# Patient Record
Sex: Male | Born: 1980 | Race: White | Hispanic: Yes | Marital: Married | Smoking: Never smoker
Health system: Southern US, Community
[De-identification: ages and names within clinical notes are randomized; demographics above are authoritative.]

## PROBLEM LIST (undated history)

## (undated) DIAGNOSIS — L0390 Cellulitis, unspecified: Secondary | ICD-10-CM

## (undated) HISTORY — PX: NO PAST SURGERIES: SHX2092

---

## 2008-07-15 ENCOUNTER — Emergency Department (HOSPITAL_COMMUNITY): Admission: EM | Admit: 2008-07-15 | Discharge: 2008-07-15 | Payer: Self-pay | Admitting: Emergency Medicine

## 2010-09-18 LAB — GLUCOSE, CAPILLARY: Glucose-Capillary: 310 mg/dL — ABNORMAL HIGH (ref 70–99)

## 2011-08-24 ENCOUNTER — Ambulatory Visit: Payer: Self-pay | Admitting: Family Medicine

## 2011-08-24 DIAGNOSIS — E119 Type 2 diabetes mellitus without complications: Secondary | ICD-10-CM

## 2011-08-24 DIAGNOSIS — R05 Cough: Secondary | ICD-10-CM

## 2011-08-24 DIAGNOSIS — J329 Chronic sinusitis, unspecified: Secondary | ICD-10-CM

## 2011-08-24 DIAGNOSIS — R059 Cough, unspecified: Secondary | ICD-10-CM

## 2011-08-24 LAB — POCT GLYCOSYLATED HEMOGLOBIN (HGB A1C)

## 2011-08-24 MED ORDER — FLUTICASONE PROPIONATE 50 MCG/ACT NA SUSP: 2.0000 | Freq: Every day | NASAL | Status: AC

## 2011-08-24 MED ORDER — AZITHROMYCIN 250 MG PO TABS: ORAL_TABLET | ORAL | Status: AC

## 2011-08-24 MED ORDER — HYDROCODONE-HOMATROPINE 5-1.5 MG/5ML PO SYRP: 5.0000 mL | ORAL_SOLUTION | Freq: Three times a day (TID) | ORAL | Status: AC | PRN

## 2011-08-24 MED ORDER — METFORMIN HCL 1000 MG PO TABS: 1000.0000 mg | ORAL_TABLET | Freq: Two times a day (BID) | ORAL | Status: DC

## 2011-08-24 NOTE — Progress Notes (Signed)
  [facility]:  Office Visit  Chief Complaint:  Chief Complaint  Patient presents with  . Abdominal Pain  . Cough  . Medication Refill    diabetes    HPI: Arthur Cox is a 31 y.o. male who complains of: 1. T2DM-need medication refill. Patient has not been taking meds x 2 months ran out of med. Denies neuropathy sxs, vision changes, urinary problems. 2. Cough-x 2 months, sinus drainge. Midepigastric abd pain when cough. Denies fevers, hemoptysis. No wheezing or SOB. ? H/o childhood asthma  Past Medical History  Diagnosis Date  . Asthma   . Diabetes mellitus    No past surgical history on file. History   Social History  . Marital Status: Single    Spouse Name: N/A    Number of Children: N/A  . Years of Education: N/A   Social History Main Topics  . Smoking status: Never Smoker   . Smokeless tobacco: None  . Alcohol Use: Yes  . Drug Use: No  . Sexually Active: None   Other Topics Concern  . None   Social History Narrative  . None   No family history on file. No Known Allergies Prior to Admission medications   Not on File     ROS: The patient denies fevers, chills, night sweats, unintentional weight loss, chest pain, palpitations, wheezing, dyspnea on exertion, nausea, vomiting, dysuria, hematuria, melena, numbness, weakness, or tingling.  All other systems have been reviewed and were otherwise negative with the exception of those mentioned in the HPI and as above.    PHYSICAL EXAM: Filed Vitals:   08/24/11 1012  BP: 132/79  Pulse: 96  Temp: 99 F (37.2 C)  Resp: 16   Filed Vitals:   08/24/11 1012  Height: 5' 4.25" (1.632 m)  Weight: 191 lb (86.637 kg)   Body mass index is 32.53 kg/(m^2).  General: Alert, no acute distress HEENT:  Normocephalic, atraumatic, oropharynx patent. Tm nl, erythematous nasal passage, erythematous throat, no exudates, + maxillary sinus tenderness Cardiovascular:  Regular rate and rhythm, no rubs murmurs or  gallops.  No Carotid bruits, radial pulse intact. No pedal edema.  Respiratory: Clear to auscultation bilaterally.  No wheezes, rales, or rhonchi.  No cyanosis, no use of accessory musculature GI: No organomegaly, abdomen is soft and non-tender, positive bowel sounds.  No masses. No guarding or rebound Skin: No rashes. Neurologic: Facial musculature symmetric. Psychiatric: Patient is appropriate throughout our interaction. Lymphatic: No cervical lymphadenopathy Musculoskeletal: Gait intact.   LABS: Results for orders placed in visit on 08/24/11  POCT GLYCOSYLATED HEMOGLOBIN (HGB A1C)      Component Value Range   Hemoglobin A1C 8.5%+       EKG/XRAY:   Primary read interpreted by Dr. Conley Rolls at [facility].   ASSESSMENT/PLAN: Encounter Diagnoses  Name Primary?  . Diabetes mellitus Yes  . Cough   . Rhinosinusitis    T2DM-not well controlled, patient was not taking meds x 2 months ran out of meds.  Rhinosinutsitis-Z-pack and cough medicine, if no improvement then need to return for Xray since has had cough for 2 months. Patient to defer xray because no insurance.     Kaiyan Luczak PHUONG, DO 08/24/2011 11:56 AM

## 2012-12-17 ENCOUNTER — Inpatient Hospital Stay (HOSPITAL_COMMUNITY)
Admission: EM | Admit: 2012-12-17 | Discharge: 2012-12-19 | DRG: 603 | Disposition: A | Discharge status: Home / Self Care | Payer: Medicaid Other | Attending: Internal Medicine | Admitting: Internal Medicine

## 2012-12-17 ENCOUNTER — Emergency Department (HOSPITAL_COMMUNITY): Payer: Medicaid Other

## 2012-12-17 ENCOUNTER — Encounter (HOSPITAL_COMMUNITY): Payer: Self-pay | Admitting: Emergency Medicine

## 2012-12-17 DIAGNOSIS — E1165 Type 2 diabetes mellitus with hyperglycemia: Secondary | ICD-10-CM | POA: Diagnosis present

## 2012-12-17 DIAGNOSIS — L02619 Cutaneous abscess of unspecified foot: Principal | ICD-10-CM | POA: Diagnosis present

## 2012-12-17 DIAGNOSIS — J45909 Unspecified asthma, uncomplicated: Secondary | ICD-10-CM | POA: Diagnosis present

## 2012-12-17 DIAGNOSIS — Z9119 Patient's noncompliance with other medical treatment and regimen: Secondary | ICD-10-CM

## 2012-12-17 DIAGNOSIS — IMO0001 Reserved for inherently not codable concepts without codable children: Secondary | ICD-10-CM | POA: Diagnosis present

## 2012-12-17 DIAGNOSIS — Z91199 Patient's noncompliance with other medical treatment and regimen due to unspecified reason: Secondary | ICD-10-CM

## 2012-12-17 DIAGNOSIS — L0390 Cellulitis, unspecified: Secondary | ICD-10-CM

## 2012-12-17 DIAGNOSIS — L03119 Cellulitis of unspecified part of limb: Principal | ICD-10-CM | POA: Diagnosis present

## 2012-12-17 DIAGNOSIS — I10 Essential (primary) hypertension: Secondary | ICD-10-CM | POA: Diagnosis present

## 2012-12-17 DIAGNOSIS — L0291 Cutaneous abscess, unspecified: Secondary | ICD-10-CM

## 2012-12-17 DIAGNOSIS — Z833 Family history of diabetes mellitus: Secondary | ICD-10-CM

## 2012-12-17 HISTORY — DX: Cellulitis, unspecified: L03.90

## 2012-12-17 LAB — CREATININE, SERUM
Creatinine, Ser: 0.54 mg/dL (ref 0.50–1.35)
GFR calc Af Amer: >90 mL/min (ref >90)
GFR calc non Af Amer: >90 mL/min (ref >90)

## 2012-12-17 LAB — CBC WITH DIFFERENTIAL/PLATELET
Basophils Absolute: 0 10*3/uL (ref 0.0–0.1)
HCT: 44.3 % (ref 39.0–52.0)
Lymphocytes Relative: 30 % (ref 12–46)
Neutro Abs: 5.7 10*3/uL (ref 1.7–7.7)
Platelets: 230 10*3/uL (ref 150–400)
RBC: 5.31 MIL/uL (ref 4.22–5.81)
RDW: 12.3 % (ref 11.5–15.5)
WBC: 9.7 10*3/uL (ref 4.0–10.5)

## 2012-12-17 LAB — CBC
MCV: 84.6 fL (ref 78.0–100.0)
Platelets: 208 10*3/uL (ref 150–400)
RBC: 5.14 MIL/uL (ref 4.22–5.81)
WBC: 8 10*3/uL (ref 4.0–10.5)

## 2012-12-17 LAB — SEDIMENTATION RATE: Sed Rate: 2 mm/hr (ref 0–16)

## 2012-12-17 LAB — HEMOGLOBIN A1C
Hgb A1c MFr Bld: 10.9 % — ABNORMAL HIGH (ref <5.7)
Mean Plasma Glucose: 266 mg/dL — ABNORMAL HIGH (ref <117)

## 2012-12-17 LAB — BASIC METABOLIC PANEL
CO2: 24 mEq/L (ref 19–32)
Chloride: 95 mEq/L — ABNORMAL LOW (ref 96–112)
Sodium: 131 mEq/L — ABNORMAL LOW (ref 135–145)

## 2012-12-17 MED ORDER — ACETAMINOPHEN 325 MG PO TABS: 650.0000 mg | ORAL_TABLET | Freq: Four times a day (QID) | ORAL | Status: DC | PRN

## 2012-12-17 MED ORDER — PIPERACILLIN-TAZOBACTAM 3.375 G IVPB
3.3750 g | Freq: Three times a day (TID) | INTRAVENOUS | Status: DC
  Administered 2012-12-17 – 2012-12-19 (×7): 3.375 g via INTRAVENOUS
  Filled 2012-12-17 (×10): qty 50

## 2012-12-17 MED ORDER — SENNOSIDES-DOCUSATE SODIUM 8.6-50 MG PO TABS
1.0000 | ORAL_TABLET | Freq: Every evening | ORAL | Status: DC | PRN
  Filled 2012-12-17: qty 1

## 2012-12-17 MED ORDER — ONDANSETRON HCL 4 MG/2ML IJ SOLN: 4.0000 mg | Freq: Four times a day (QID) | INTRAMUSCULAR | Status: DC | PRN

## 2012-12-17 MED ORDER — VANCOMYCIN HCL IN DEXTROSE 1-5 GM/200ML-% IV SOLN
1000.0000 mg | Freq: Once | INTRAVENOUS | Status: AC
  Administered 2012-12-17: 1000 mg via INTRAVENOUS
  Filled 2012-12-17: qty 200

## 2012-12-17 MED ORDER — MUPIROCIN CALCIUM 2 % EX CREA
TOPICAL_CREAM | Freq: Two times a day (BID) | CUTANEOUS | Status: DC
  Administered 2012-12-17 – 2012-12-19 (×4): via TOPICAL
  Filled 2012-12-17: qty 15

## 2012-12-17 MED ORDER — SODIUM CHLORIDE 0.9 % IV SOLN
INTRAVENOUS | Status: DC
  Administered 2012-12-17: 08:00:00 via INTRAVENOUS

## 2012-12-17 MED ORDER — ALUM & MAG HYDROXIDE-SIMETH 200-200-20 MG/5ML PO SUSP
30.0000 mL | Freq: Four times a day (QID) | ORAL | Status: DC | PRN
  Filled 2012-12-17: qty 30

## 2012-12-17 MED ORDER — LIVING WELL WITH DIABETES BOOK - IN SPANISH
Freq: Once | Status: DC
  Filled 2012-12-17 (×2): qty 1

## 2012-12-17 MED ORDER — GLIPIZIDE 5 MG PO TABS
5.0000 mg | ORAL_TABLET | Freq: Two times a day (BID) | ORAL | Status: DC
  Administered 2012-12-17 – 2012-12-18 (×2): 5 mg via ORAL
  Filled 2012-12-17 (×5): qty 1

## 2012-12-17 MED ORDER — VANCOMYCIN HCL 10 G IV SOLR
1500.0000 mg | Freq: Two times a day (BID) | INTRAVENOUS | Status: DC
  Administered 2012-12-17 – 2012-12-19 (×5): 1500 mg via INTRAVENOUS
  Filled 2012-12-17 (×6): qty 1500

## 2012-12-17 MED ORDER — MORPHINE SULFATE 4 MG/ML IJ SOLN
4.0000 mg | Freq: Once | INTRAMUSCULAR | Status: AC
  Administered 2012-12-17: 4 mg via INTRAVENOUS
  Filled 2012-12-17: qty 1

## 2012-12-17 MED ORDER — INSULIN ASPART 100 UNIT/ML ~~LOC~~ SOLN: 0.0000 [IU] | Freq: Three times a day (TID) | SUBCUTANEOUS | Status: DC

## 2012-12-17 MED ORDER — HYDRALAZINE HCL 20 MG/ML IJ SOLN: 10.0000 mg | Freq: Four times a day (QID) | INTRAMUSCULAR | Status: DC | PRN

## 2012-12-17 MED ORDER — INSULIN ASPART 100 UNIT/ML ~~LOC~~ SOLN
3.0000 [IU] | Freq: Three times a day (TID) | SUBCUTANEOUS | Status: DC
  Administered 2012-12-17: 3 [IU] via SUBCUTANEOUS
  Administered 2012-12-17: 13:00:00 via SUBCUTANEOUS
  Administered 2012-12-18 – 2012-12-19 (×6): 3 [IU] via SUBCUTANEOUS

## 2012-12-17 MED ORDER — INSULIN ASPART 100 UNIT/ML ~~LOC~~ SOLN: 0.0000 [IU] | Freq: Every day | SUBCUTANEOUS | Status: DC

## 2012-12-17 MED ORDER — ASPIRIN EC 81 MG PO TBEC
81.0000 mg | DELAYED_RELEASE_TABLET | Freq: Every day | ORAL | Status: DC
  Administered 2012-12-17 – 2012-12-19 (×3): 81 mg via ORAL
  Filled 2012-12-17 (×3): qty 1

## 2012-12-17 MED ORDER — ONDANSETRON HCL 4 MG PO TABS: 4.0000 mg | ORAL_TABLET | Freq: Four times a day (QID) | ORAL | Status: DC | PRN

## 2012-12-17 MED ORDER — ENOXAPARIN SODIUM 40 MG/0.4ML ~~LOC~~ SOLN
40.0000 mg | SUBCUTANEOUS | Status: DC
  Administered 2012-12-17 – 2012-12-19 (×3): 40 mg via SUBCUTANEOUS
  Filled 2012-12-17 (×3): qty 0.4

## 2012-12-17 MED ORDER — ACETAMINOPHEN 650 MG RE SUPP: 650.0000 mg | Freq: Four times a day (QID) | RECTAL | Status: DC | PRN

## 2012-12-17 MED ORDER — OXYCODONE HCL 5 MG PO TABS
5.0000 mg | ORAL_TABLET | ORAL | Status: DC | PRN
  Administered 2012-12-17 – 2012-12-18 (×4): 5 mg via ORAL
  Filled 2012-12-17 (×6): qty 1

## 2012-12-17 MED ORDER — INSULIN ASPART 100 UNIT/ML ~~LOC~~ SOLN
0.0000 [IU] | Freq: Three times a day (TID) | SUBCUTANEOUS | Status: DC
Start: 2012-12-17 — End: 2012-12-19
  Administered 2012-12-17 – 2012-12-18 (×4): 3 [IU] via SUBCUTANEOUS
  Administered 2012-12-18: 5 [IU] via SUBCUTANEOUS
  Administered 2012-12-18: 1 [IU] via SUBCUTANEOUS
  Administered 2012-12-19 (×2): 2 [IU] via SUBCUTANEOUS
  Administered 2012-12-19: 1 [IU] via SUBCUTANEOUS

## 2012-12-17 MED ORDER — SODIUM CHLORIDE 0.9 % IV SOLN
Freq: Once | INTRAVENOUS | Status: AC
  Administered 2012-12-17: 04:00:00 via INTRAVENOUS

## 2012-12-17 MED ORDER — TRAZODONE 25 MG HALF TABLET
25.0000 mg | ORAL_TABLET | Freq: Every evening | ORAL | Status: DC | PRN
  Filled 2012-12-17 (×2): qty 1

## 2012-12-17 NOTE — ED Notes (Signed)
MD at bedside. 

## 2012-12-17 NOTE — Progress Notes (Signed)
Interpreter Maveric Debono Namihira for Nancy RN 

## 2012-12-17 NOTE — Progress Notes (Signed)
ANTIBIOTIC CONSULT NOTE - INITIAL  Pharmacy Consult for vancomycin Indication: diabetic foot ulcer / cellulitis  No Known Allergies  Patient Measurements:   Adjusted Body Weight:   Vital Signs: Temp: 98.1 F (36.7 C) (07/17 0755) Temp src: Oral (07/17 0755) BP: 193/89 mmHg (07/17 0755) Pulse Rate: 101 (07/17 0755) Intake/Output from previous day: 07/16 0701 - 07/17 0700 In: 1250 [I.V.:1250] Out: -  Intake/Output from this shift:    Labs:  Recent Labs  12/17/12 0410 12/17/12 1052  WBC 9.7 8.0  HGB 16.3 15.0  PLT 230 208  CREATININE 0.72 0.54   The CrCl is unknown because both a height and weight (above a minimum accepted value) are required for this calculation. No results found for this basename: VANCOTROUGH, VANCOPEAK, VANCORANDOM, GENTTROUGH, GENTPEAK, GENTRANDOM, TOBRATROUGH, TOBRAPEAK, TOBRARND, AMIKACINPEAK, AMIKACINTROU, AMIKACIN,  in the last 72 hours   Microbiology: No results found for this or any previous visit (from the past 720 hour(s)).  Medical History: Past Medical History  Diagnosis Date  . Asthma   . Diabetes mellitus   . Cellulitis 12/17/2012    RIGHT FOOT    Medications:  Scheduled:  . aspirin EC  81 mg Oral Daily  . enoxaparin (LOVENOX) injection  40 mg Subcutaneous Q24H  . glipiZIDE  5 mg Oral BID AC  . insulin aspart  0-9 Units Subcutaneous TID WC  . insulin aspart  3 Units Subcutaneous TID WC  . living well with diabetes book- in spanish   Does not apply Once  . mupirocin cream   Topical BID  . piperacillin-tazobactam (ZOSYN)  IV  3.375 g Intravenous Q8H   Infusions:  . sodium chloride 100 mL/hr at 12/17/12 6213   Assessment: 32 yo male with diabetic foot ulcer / cellulitis will be continued on vancomycin.  Patient received vancomycin 1g at 0403 on 12/17/12.  SCr 0.54 (CrCl >100); Wt 86.6 kg  Goal of Therapy:  Vancomycin trough level 10-15 mcg/ml  Plan:  1) Vancomycin 1500mg  iv q12h, 1st dose now 2) Monitor renal function,  culture, and plan on antibiotic before checking vancomycin trough.  Arthur Cox, Tsz-Yin 12/17/2012,2:01 PM

## 2012-12-17 NOTE — Plan of Care (Signed)
Problem: Food- and Nutrition-Related Knowledge Deficit (NB-1.1) Goal: Nutrition education Formal process to instruct or train a patient/client in a skill or to impart knowledge to help patients/clients voluntarily manage or modify food choices and eating behavior to maintain or improve health. Outcome: Completed/Met Date Met:  12/17/12 RD consulted for diet education regarding DM/uncontrolled blood sugars. DM coordinators note reviewed. Pt has been with out medications for his DM for several months. DM coordinator provided basic nutrition information.  RD provided pt with "Carbohydrate Counting for Diabetes" hand out in Spanish. Pt speaks very little English, but would read the hand out. Denied any questions at this time. Pt will likely have better blood sugar control with compliance with medication.   Chart reviewed, no additional nutrition interventions warranted at this time. Pt may benefit from outpatient diabetes education if able. Please make referral if appropriate. Please re-consult as needed.    Clarene Duke RD, LDN Pager [phone] After Hours pager [phone]

## 2012-12-17 NOTE — Progress Notes (Signed)
Utilization review completed. Branda Chaudhary, RN, BSN. 

## 2012-12-17 NOTE — ED Notes (Signed)
Pt. Speaks minimal English.

## 2012-12-17 NOTE — ED Notes (Signed)
Pt. Returned from xray 

## 2012-12-17 NOTE — H&P (Signed)
[facility] History and Physical  Dock Baccam ZOX:[id number] DOB: Mar 06, 1981 DOA: 12/17/2012  Referring physician: Derwood Kaplan PCP: Toya Smothers R  Specialists: None  Chief Complaint: Right foot scab with associated redness, pain and swelling  HPI: Arthur Cox is a 32 y.o. male with a history of uncontrolled diabetes, who presented to the ED complaining of right foot pain.  About a week ago he thinks he was bitten by a bug on his right foot, since then he began scratching that area and developed redness and swelling in the area two days ago.  Last night he began having pain in his right foot that radiated into his leg.  He has tried applying lemon, salt, and an unknown cream to the sore on his foot, without relief.   He has never had anything similar to this before.  Four months ago he stopped taking his diabetes medications due to not being able to afford them.  He has not been monitoring his blood sugar.   His last BM was last PM.  He has no other complaints and denies fever, nausea, vomiting, diarrhea, chest pain, sob, dizziness, lightheadedness, abdominal pain.   Review of Systems: The patient denies anorexia, chest pain, syncope, dyspnea on exertion, peripheral edema, balance deficits, abdominal pain, melena, hematochezia,difficulty walking.  Past Medical History  Diagnosis Date  . Asthma   . Diabetes mellitus   . Cellulitis 12/17/2012    RIGHT FOOT   Past Surgical History  Procedure Laterality Date  . No past surgeries     Social History:  reports that he has never smoked. He has never used smokeless tobacco. He reports that  drinks alcohol. He reports that he does not use illicit drugs. Lives at home Independent of ADLs  No Known Allergies  Family history: Mother-Diabetes, Cancer (unknown type) Father-Diabetes  Prior to Admission medications   Not on File   Physical Exam: Filed Vitals:   12/17/12 0515 12/17/12 0545 12/17/12 0700 12/17/12 0755  BP: 129/86 125/83  124/84 193/89  Pulse: 89 85 80 101  Temp:    98.1 F (36.7 C)  TempSrc:    Oral  Resp: 20 17 17 20   SpO2: 98% 97% 96% 98%     General: WNWD. NAD.  Neck: Normal range of motion.  Neck supple.  Cardiovascular: RRR. no m/r/g.  Respiratory: BBS e and c  Abdomen: Soft, non-tender to palpation.  No distension. Bowel sounds are normal.  Skin: Approximately 3cm scab to dorsal right foot, with surrounding erythema, and swelling.  Tenderness to palpation.  No purulent drainage.    Musculoskeletal: No decrease in rom or strength in RLE  Neurological: CAO x 3.   Labs on Admission:  Basic Metabolic Panel:  Recent Labs Lab 12/17/12 0410  NA 131*  K 3.7  CL 95*  CO2 24  GLUCOSE 358*  BUN 12  CREATININE 0.72  CALCIUM 9.4   CBC:  Recent Labs Lab 12/17/12 0410  WBC 9.7  NEUTROABS 5.7  HGB 16.3  HCT 44.3  MCV 83.4  PLT 230    Radiological Exams on Admission: Dg Foot 2 Views Right  12/17/2012   *RADIOLOGY REPORT*  Clinical Data: Dorsal right foot soreness.  RIGHT FOOT - 2 VIEW  Comparison: None.  Findings: There is no evidence of fracture or dislocation.  The joint spaces are preserved.  There is no evidence of talar subluxation; the subtalar joint is unremarkable in appearance.  No significant soft tissue abnormalities are seen.  IMPRESSION: No evidence  of fracture or dislocation.   Original Report Authenticated By: Tonia Ghent, M.D.    EKG: None obtained  Assessment/Plan  Cellulitis of right foot -Received a dose of vancomycin in the ED-this will be continued -IV Zosyn started -X-ray of right foot shows no significant soft tissue abnormalities -Wound Care RN asked to make recommendations  Uncontrolled diabetes -A1C of 8.5s last year-recheck today -Glucose in ED of 358 -Sensitive sliding scale insulin, with low dose meal coverage. Start Glucotrol, await A1C, if less than 10, can consider adding Metformin on discharge. If A1C significantly elevated-may need  Insulin started -Monitor blood glucose -Carb modified diet -Diabetic Coordinator for education and recommendations. -Consult with Case management to see if patient can be assisted with diabetic medications  Hypertension -PRN hydralazine -Monitor and consider starting oral medications if appropriate.   Code Status: Unknown Family Communication: No family in room this AM Disposition Plan: Inpatient  Time spent: 45 minutes  Marco Collie, [place] [facility] [phone]   If 7PM-7AM, please contact night-coverage [email or web address] Password [facility] 12/17/2012, 10:59 AM   Attending Patient seen and examined, agree with the assessment and plan as outlined above. Continue antibiotics, cellulitic area has been marked, will need to follow clinically. Await A1C. Needs diabetic education and compliance couseling  S Amelianna Meller

## 2012-12-17 NOTE — ED Notes (Signed)
Pt. Has 2cm by 2cm wound on top of right foot. States he got a scratch on Sunday but wound as gotten worse. Skin surrounding wound is red and warm to touch and slightly swollen. Pedal pulse intact, cap refill instant, sensation intact distally from wound. Tender to palpation.

## 2012-12-17 NOTE — ED Notes (Signed)
PT. REPORTS PROGRESSING RIGHT FOOT ABSCESS WITH DRAINAGE ONSET Sunday , DENIES FEVER OR CHILLS .

## 2012-12-17 NOTE — Care Management Note (Signed)
Noted referral to case manager, re financial concerns. CM can assist this pt with medications at d/c, 30 day supply for $3 copay x one only, no pain meds or sedatives. Will setup followup appointment at [facility] on [address] unless pt is to be followed at clinic. Will inform pt of this when interpeter available.  Johny Shock RN MPH Case Manager [phone]

## 2012-12-17 NOTE — Progress Notes (Addendum)
Inpatient Diabetes Program Recommendations  AACE/ADA: New Consensus Statement on Inpatient Glycemic Control (2013)  Target Ranges:  Prepandial:   less than 140 mg/dL      Peak postprandial:   less than 180 mg/dL (1-2 hours)      Critically ill patients:  140 - 180 mg/dL     Patient admitted with cellulitis.  Has history of Type 2 diabetes.  Not taking any medication at home to control his CBGs for 4 months now due to inability to afford medications.  Went in to speak with patient about his DM care and regimen at home (spoke with patient in both limited Spanish and [place]- wife/girlfriend of patient in room to help translate).  Patient told me he stopped his Metformin about 4 months ago b/c he did not have a Rx for it and it was costing him too much money.  Does not have a PCP.  Spoke with pt about his diagnosis.  Discussed basic pathophysiology of DM Type 2, basic home care, importance of checking CBGs and maintaining good CBG control to prevent long-term and short-term complications.  Also discussed basic nutritional guidelines and gave patient a carbohydrate counting guide in Spanish.  Explained to patient that he should avoid sugar sweetened beverages and that he should be careful of his portion sizes of carbohydrate containing foods.  Discussed which foods have carbohydrates.  Patient told me he will eat 10 tortillas in one meal.  Encouraged patient to eat 1 or 2 tortillas per meal (depending on how big the tortillas are) and explained that each meal should have between 60-75 grams of carbohydrates total.  Patient appeared shocked that he can't eat so much with mealtimes.  RNs to provide ongoing basic DM education at bedside with this patient.  Have ordered educational booklet (in [place]) and Spanish DM videos.  Will ask RD to see patient to provide further nutritional instruction.  Will ask care management to see patient to see if we can get him set up at the [facility] after d/c for primary care.   MD- Please order A1c test so we can assess patient's pre-hospital glucose control.  Likely to be elevated since he has an ongoing infection.  Also, please keep cost considerations in mind when making decisions about meds to send patient home on- Patient has no health insurance.  Thanks!   Will follow. Ambrose Finland RN, MSN, CDE Diabetes Coordinator Inpatient Diabetes Program [phone]

## 2012-12-17 NOTE — ED Provider Notes (Signed)
History    CSN: [id number] Arrival date & time 12/17/12  0212  First MD Initiated Contact with Patient 12/17/12 [phone]     Chief Complaint  Patient presents with  . Foot Pain   (Consider location/radiation/quality/duration/timing/severity/associated sxs/prior Treatment) HPI Comments: 32 y/o comes in with cc of foot pain. On Sunday, patient ended up damaging himself while scratching, and overtime the area has gotten more red, largert and more painful. There is no n/v/f/c. The pain is worse with movement. He thinks there was whitish discharge earlier in the morning. Pt has hx of diabetes, not taking his meds.  Patient is a 32 y.o. male presenting with lower extremity pain. The history is provided by the patient.  Foot Pain   Past Medical History  Diagnosis Date  . Asthma   . Diabetes mellitus    History reviewed. No pertinent past surgical history. No family history on file. History  Substance Use Topics  . Smoking status: Never Smoker   . Smokeless tobacco: Not on file  . Alcohol Use: Yes    Review of Systems  Constitutional: Positive for activity change. Negative for fever and chills.  Gastrointestinal: Negative for nausea and vomiting.  Musculoskeletal: Positive for myalgias and arthralgias.  Skin: Positive for rash.    Allergies  Review of patient's allergies indicates no known allergies.  Home Medications  No current outpatient prescriptions on file. BP 125/83  Pulse 85  Temp(Src) 99.5 F (37.5 C) (Oral)  Resp 17  SpO2 97% Physical Exam  Nursing note and vitals reviewed. Constitutional: He is oriented to person, place, and time. He appears well-developed.  HENT:  Head: Normocephalic and atraumatic.  Eyes: Conjunctivae and EOM are normal. Pupils are equal, round, and reactive to light.  Neck: Normal range of motion. Neck supple.  Cardiovascular: Normal rate and regular rhythm.   Pulmonary/Chest: Effort normal and breath sounds normal.  Abdominal: Soft.  Bowel sounds are normal. He exhibits no distension. There is no tenderness. There is no rebound and no guarding.  Musculoskeletal:  Pt has a 3cm scab in the dorsal right foot, with surrounding erythema, and swelling. Significant tenderness to palpation. No purulent drainage. Tenderness worse with passive ROM of the ankle. The palpable tenderness tracks up to the distal tibia.  Neurological: He is alert and oriented to person, place, and time.  Skin: Skin is warm.    ED Course  Procedures (including critical care time) Labs Reviewed  CBC WITH DIFFERENTIAL - Abnormal; Notable for the following:    MCHC 36.8 (*)    All other components within normal limits  BASIC METABOLIC PANEL - Abnormal; Notable for the following:    Sodium 131 (*)    Chloride 95 (*)    Glucose, Bld 358 (*)    All other components within normal limits   Dg Foot 2 Views Right  12/17/2012   *RADIOLOGY REPORT*  Clinical Data: Dorsal right foot soreness.  RIGHT FOOT - 2 VIEW  Comparison: None.  Findings: There is no evidence of fracture or dislocation.  The joint spaces are preserved.  There is no evidence of talar subluxation; the subtalar joint is unremarkable in appearance.  No significant soft tissue abnormalities are seen.  IMPRESSION: No evidence of fracture or dislocation.   Original Report Authenticated By: Tonia Ghent, M.D.   No diagnosis found.  MDM  Pt with hx of diabetes - non compliance, uninsurance, no follow up - comes in with foot pain. Pt has clinical evidence of cellulitis. Possible  abscess, although no drainage appreciated during our exam. No clear evidence of abscess. I think the infection is deep, and spreading though - Xray ordered to ensure there is no free air. Joint involvement not suspected. Will admit with ivab. Admitting team will berequested to monitor closely, and if needed get further diagnostic imaging.  Derwood Kaplan, MD 12/17/12 [phone]

## 2012-12-17 NOTE — Consult Note (Addendum)
WOC consult Note Reason for Consult: Consult requested for right foot wound.  Able to converse with patient in limited manner in Spanish. He is on IV antibiotics for treatment. Wound type: cellulitis; no open wound at present Measurement: 2X1cm  Wound bed: dark purple blistered wound bed; has evolved into dry hard scab.  Expect it will become full thickness skin loss when scab is softened and removed. Drainage (amount, consistency, odor) No drainage at present. Periwound: Generalized erythremia surrounding wound to approx 4 cm.  Area has been marked and appears to be receeding slowly, according to patient. Dressing procedure/placement/frequency: Bactroban BID to soften scab and promote healing. Please re-consult if further assistance is needed.  Thank-you,  Cammie Mcgee MSN, RN, CWOCN, [place], CNS [phone]

## 2012-12-18 DIAGNOSIS — I10 Essential (primary) hypertension: Secondary | ICD-10-CM

## 2012-12-18 LAB — CBC
HCT: 41.2 % (ref 39.0–52.0)
RDW: 12.5 % (ref 11.5–15.5)
WBC: 7 10*3/uL (ref 4.0–10.5)

## 2012-12-18 LAB — BASIC METABOLIC PANEL
Chloride: 103 mEq/L (ref 96–112)
Creatinine, Ser: 0.66 mg/dL (ref 0.50–1.35)
GFR calc Af Amer: >90 mL/min (ref >90)
Potassium: 4.6 mEq/L (ref 3.5–5.1)

## 2012-12-18 LAB — GLUCOSE, CAPILLARY
Glucose-Capillary: 164 mg/dL — ABNORMAL HIGH (ref 70–99)
Glucose-Capillary: 290 mg/dL — ABNORMAL HIGH (ref 70–99)
Glucose-Capillary: 146 mg/dL — ABNORMAL HIGH (ref 70–99)

## 2012-12-18 MED ORDER — BD GETTING STARTED TAKE HOME KIT: 1/2ML X 30G SYRINGES
1.0000 | Freq: Once | Status: AC
  Administered 2012-12-18: 1
  Filled 2012-12-18: qty 1

## 2012-12-18 MED ORDER — LISINOPRIL 5 MG PO TABS
5.0000 mg | ORAL_TABLET | Freq: Every day | ORAL | Status: DC
  Administered 2012-12-18 – 2012-12-19 (×2): 5 mg via ORAL
  Filled 2012-12-18 (×2): qty 1

## 2012-12-18 MED ORDER — INSULIN GLARGINE 100 UNIT/ML ~~LOC~~ SOLN
10.0000 [IU] | Freq: Every day | SUBCUTANEOUS | Status: DC
  Administered 2012-12-18 – 2012-12-19 (×2): 10 [IU] via SUBCUTANEOUS
  Filled 2012-12-18 (×2): qty 0.1

## 2012-12-18 NOTE — Progress Notes (Addendum)
Pt. Has been practicing how to give the insulin himself.The RN has been assessing and assisting pt. As well.pt. Got the "Living well with diabetes" on spanish.Wife at the bedside,very supportive.keep monitoring pt. Closely.Pt. Also got the getting started kit.

## 2012-12-18 NOTE — Progress Notes (Signed)
[facility] PROGRESS NOTE  Arthur Cox ZOX:[id number] DOB: [birth year] DOA: 12/17/2012 PCP: Arthur Cox  Assessment/Plan  Cellulitis of the right foot, improving on vancomycin and Zosyn -  Anticipate transitioning over to oral antibiotics, likely clindamycin and ciprofloxacin tomorrow to complete a seven-day course  Diabetes mellitus type 2, uncontrolled -  Hemoglobin A1c of 10.9 -  All in the hospital, continue long-acting insulin with Arthur Cox acting insulin with meals - Start Lantus 10 units once daily -  Continue low-dose sliding scale insulin -  We'll have the diabetes educator talk to the patient about insulin 7030 -  Nursing staff and diabetes educator to practice fingersticks and insulin injections with the patient -  Case manager working on providing assistance for medications, including orange card  Hypertension, blood pressures were elevated somewhat yesterday -  Start low-dose lisinopril -  Will need BMP and one to 2 weeks to check creatinine  Diet:  Diabetic diet Access:  PIV IVF:  KVO Proph:  Lovenox  Code Status: Full code Family Communication: Spoke with the patient alone Disposition Plan: Pending improvement in his cellulitis and further education about diet 80s management, home tomorrow   Consultants:  Diabetes educator Case management nutrition   Procedures:   x-ray foot  Antibiotics:  Vancomycin and Zosyn from July 17   HPI/Subjective:  Spoke via interpreter: Patient states the pain and swelling and redness of his right foot have improved.  He denies fevers and chills, vomiting, constipation. He had an episode of abdominal pain near his bellybutton yesterday that resolved.   Objective: Filed Vitals:   12/17/12 0755 12/17/12 1532 12/17/12 1800 12/18/12 0458  BP: 193/89 125/66 125/74 127/83  Pulse: 101 89 86 81  Temp: 98.1 F (36.7 C) 98.4 F (36.9 C) 98.4 F (36.9 C) 98.5 F (36.9 C)  TempSrc: Oral Oral Oral Oral  Resp: 20 20 20 17    SpO2: 98% 98%  98%    Intake/Output Summary (Last 24 hours) at 12/18/12 1028 Last data filed at 12/17/12 1843  Gross per 24 hour  Intake    480 ml  Output      0 ml  Net    480 ml   There were no vitals filed for this visit.  Exam:   General:  Hispanic Spanish-speaking only male,  No acute distress  HEENT:  NCAT,  MMM  Cardiovascular:   RRR, nl S1, S2 no mrg, 2+ pulses, warm extremities  Respiratory:   CTAB, no increased WOB  Abdomen:   NABS, soft, NT/ND  MSK:   Normal tone and bulk, no LEE, right foot with a 1-1/2-2 cm eschar on the dorsum of the foot with some surrounding erythema extending approximately 2 cm. The area of erythema has withdrawn from the previously drawn line.  Some induration and warmth still present.   Neuro:   Grossly intact  Data Reviewed: Basic Metabolic Panel:  Recent Labs Lab 12/17/12 0410 12/17/12 1052 12/18/12 0440  NA 131*  --  138  K 3.7  --  4.6  CL 95*  --  103  CO2 24  --  28  GLUCOSE 358*  --  176*  BUN 12  --  9  CREATININE 0.72 0.54 0.66  CALCIUM 9.4  --  8.6   Liver Function Tests: No results found for this basename: AST, ALT, ALKPHOS, BILITOT, PROT, ALBUMIN,  in the last 168 hours No results found for this basename: LIPASE, AMYLASE,  in the last 168 hours No results found  for this basename: AMMONIA,  in the last 168 hours CBC:  Recent Labs Lab 12/17/12 0410 12/17/12 1052 12/18/12 0440  WBC 9.7 8.0 7.0  NEUTROABS 5.7  --   --   HGB 16.3 15.0 14.4  HCT 44.3 43.5 41.2  MCV 83.4 84.6 85.8  PLT 230 208 191   Cardiac Enzymes: No results found for this basename: CKTOTAL, CKMB, CKMBINDEX, TROPONINI,  in the last 168 hours BNP (last 3 results) No results found for this basename: PROBNP,  in the last 8760 hours CBG:  Recent Labs Lab 12/17/12 0748 12/17/12 1117 12/17/12 1650 12/17/12 2223 12/18/12 0744  GLUCAP 247* 218* 209* 164* 290*    No results found for this or any previous visit (from the past 240  hour(s)).   Studies: Dg Foot 2 Views Right  12/17/2012   *RADIOLOGY REPORT*  Clinical Data: Dorsal right foot soreness.  RIGHT FOOT - 2 VIEW  Comparison: None.  Findings: There is no evidence of fracture or dislocation.  The joint spaces are preserved.  There is no evidence of talar subluxation; the subtalar joint is unremarkable in appearance.  No significant soft tissue abnormalities are seen.  IMPRESSION: No evidence of fracture or dislocation.   Original Report Authenticated By: Arthur Cox, M.D.    Scheduled Meds: . aspirin EC  81 mg Oral Daily  . enoxaparin (LOVENOX) injection  40 mg Subcutaneous Q24H  . glipiZIDE  5 mg Oral BID AC  . insulin aspart  0-9 Units Subcutaneous TID WC  . insulin aspart  3 Units Subcutaneous TID WC  . insulin glargine  10 Units Subcutaneous Daily  . living well with diabetes book- in spanish   Does not apply Once  . mupirocin cream   Topical BID  . piperacillin-tazobactam (ZOSYN)  IV  3.375 g Intravenous Q8H  . vancomycin  1,500 mg Intravenous Q12H   Continuous Infusions: . sodium chloride 100 mL/hr at 12/17/12 1610    Active Problems:   Cellulitis   Type II or unspecified type diabetes mellitus without mention of complication, uncontrolled   HTN (hypertension)    Time spent: 30 min    Arthur Cox, [facility]  [facility] Pager [phone]. If 7PM-7AM, please contact night-coverage at [email or web address], password [facility] 12/18/2012, 10:28 AM  LOS: 1 day

## 2012-12-18 NOTE — Care Management Note (Signed)
   CARE MANAGEMENT NOTE 12/18/2012  Patient:  GRIFFEY, NICASIO   Account Number:  [id number]  Date Initiated:  12/18/2012  Documentation initiated by:  Isley Zinni  Subjective/Objective Assessment:   referral for assistance with medications and followup care.     Action/Plan:   Match letter to assist with medications prepared and appointment scheduled at [facility] and [facility].   Anticipated DC Date:  12/18/2012   Anticipated DC Plan:  HOME/SELF CARE      DC Planning Services  MATCH Program  Follow-up appt scheduled      Choice offered to / List presented to:             Status of service:  Completed, signed off Medicare Important Message given?   (If response is "NO", the following Medicare IM given date fields will be blank) Date Medicare IM given:   Date Additional Medicare IM given:    Discharge Disposition:  HOME/SELF CARE  Per UR Regulation:    If discussed at Long Length of Stay Meetings, dates discussed:    Comments:  12/18/2012 Met with pt RN who is Spanish speaking and she learned that pt does have a glucometer, will need strips however CM is unable to assist with this. MATCH letter prepared and in front of chart, pt RN is aware of letter for this pt to receive medications at $3 per prescription in chart. Follow up appointment scheduled at [facility] and [facility] for first available, December 30, 2012 @ 11:30am. Pt will also be given info on how to apply for an orange card, phone number provided. Johny Shock RN MPH Case Manager [phone]

## 2012-12-18 NOTE — Progress Notes (Signed)
Inpatient Diabetes Program Recommendations  AACE/ADA: New Consensus Statement on Inpatient Glycemic Control (2013)  Target Ranges:  Prepandial:   less than 140 mg/dL      Peak postprandial:   less than 180 mg/dL (1-2 hours)      Critically ill patients:  140 - 180 mg/dL  Results for Arthur Cox, Arthur Cox (MRN [id number]) as of 12/18/2012 12:22  Ref. Range 12/17/2012 07:48 12/17/2012 11:17 12/17/2012 16:50 12/17/2012 22:23 12/18/2012 07:44  Glucose-Capillary Latest Range: 70-99 mg/dL 829 (H) 562 (H) 130 (H) 164 (H) 290 (H)    Inpatient Diabetes Program Recommendations Insulin - Basal: 70/30 10 units BID with breakfast and supper  Recommend switching while hospitalized to see how effective and titrate doses as needed.  When discharged will need RX for ReliOn Novolin 70/30.  ReliOn is purchased at [employer] and is approximately $24.88 a vial.   While in hospital it will be Novolog 70/30 because Novolin is not on formulary.  Thank you  Piedad Climes BSN, RN,CDE Inpatient Diabetes Coordinator [phone] (team pager)

## 2012-12-18 NOTE — Progress Notes (Signed)
RD consulted for ongoing DM education. Pt is spanish speaking, but is able to speak and understand some [place].  RD has provided pt with Spanish hand out's for Carbohydrate counting on 7/17.  RD spoke with pt today who states he has gone over the hand out. Was able to state types of foods that will raise his blood sugar and knows those foods need to be eaten in moderation.  Pt denied any questions about the diet at this time.  Pt may benefit from more detail diet education in the out patient setting. If agree, please refer to [facility].   No additional nutrition interventions warranted at this time. Please re-consult as needed.   Clarene Duke RD, LDN Pager [phone] After Hours pager [phone]

## 2012-12-19 LAB — GLUCOSE, CAPILLARY: Glucose-Capillary: 179 mg/dL — ABNORMAL HIGH (ref 70–99)

## 2012-12-19 MED ORDER — GLUCOSE 4 G PO CHEW: 16.0000 g | CHEWABLE_TABLET | ORAL | Status: DC | PRN

## 2012-12-19 MED ORDER — ASPIRIN 81 MG PO TBEC: 81.0000 mg | DELAYED_RELEASE_TABLET | Freq: Every day | ORAL | Status: DC

## 2012-12-19 MED ORDER — INSULIN NPH ISOPHANE & REGULAR (70-30) 100 UNIT/ML ~~LOC~~ SUSP: 14.0000 [IU] | Freq: Two times a day (BID) | SUBCUTANEOUS | Status: DC

## 2012-12-19 MED ORDER — CIPROFLOXACIN HCL 750 MG PO TABS: 750.0000 mg | ORAL_TABLET | Freq: Two times a day (BID) | ORAL | Status: DC

## 2012-12-19 MED ORDER — LISINOPRIL 5 MG PO TABS: 5.0000 mg | ORAL_TABLET | Freq: Every day | ORAL | Status: DC

## 2012-12-19 MED ORDER — "INSULIN SYRINGE 31G X 5/16"" 0.3 ML MISC": Status: DC

## 2012-12-19 MED ORDER — CLINDAMYCIN HCL 150 MG PO CAPS: 450.0000 mg | ORAL_CAPSULE | Freq: Three times a day (TID) | ORAL | Status: DC

## 2012-12-19 NOTE — Discharge Summary (Signed)
Physician Discharge Summary  Arthur Cox ZHY:[id number] DOB: 1980-07-03 DOA: 12/17/2012  PCP: Toya Smothers R  Admit date: 12/17/2012 Discharge date: 12/19/2012  Recommendations for Outpatient Follow-up:  Followup with primary care doctor within one week of discharge. Review fingersticks, repeat BMP after starting lisinopril.  Will need routine diabetes screening tests and referrals.    Discharge Diagnoses:  Active Problems:   Cellulitis   Type II or unspecified type diabetes mellitus without mention of complication, uncontrolled   HTN (hypertension)   Discharge Condition: Stable, improved  Diet recommendation: Diabetic  Wt Readings from Last 3 Encounters:  08/24/11 86.637 kg (191 lb)    History of present illness:  Arthur Cox is a 32 y.o. male with a history of uncontrolled diabetes, who presented to the ED complaining of right foot pain. About a week ago he thinks he was bitten by a bug on his right foot, since then he began scratching that area and developed redness and swelling in the area two days ago. Last night he began having pain in his right foot that radiated into his leg. He has tried applying lemon, salt, and an unknown cream to the sore on his foot, without relief. He has never had anything similar to this before. Four months ago he stopped taking his diabetes medications due to not being able to afford them. He has not been monitoring his blood sugar. His last BM was last PM. He has no other complaints and denies fever, nausea, vomiting, diarrhea, chest pain, sob, dizziness, lightheadedness, abdominal pain.  Hospital Course:   Cellulitis of the right foot. He was started on vancomycin and Zosyn because of his underlying diabetes. He will be transitioned over to oral antibiotics to complete a seven-day course. The erythema and swelling quickly improved.    Diabetes mellitus type 2, uncontrolled. Hemoglobin A1c of 10.9. He was started on injectable insulin.  He was given  instructions about how to check his fingersticks at home and how to perform insulin injections. He demonstrated proper technique for both. This was a previous diagnosis of diabetes he had a glucometer at home, however he had been unable to afford his medications previously. He worked with the case Production designer, theatre/television/film to provide him assistance with medications including an orange card.  He will continue 70/30 insulin 14 units twice daily.    Hypertension, his blood pressures were somewhat elevated. He was started on low-dose lisinopril. He will need a followup BMP in one to 2 weeks to check his creatinine.  Consultants:  Diabetes educator Case management  nutrition  Procedures:  x-ray foot Antibiotics:  Vancomycin and Zosyn from July 17   Discharge Exam: Filed Vitals:   12/19/12 0516  BP: 113/73  Pulse: 89  Temp: 98.2 F (36.8 C)  Resp: 18   Filed Vitals:   12/18/12 1355 12/18/12 1810 12/18/12 2136 12/19/12 0516  BP: 125/69 115/67 116/86 113/73  Pulse: 89 84 79 89  Temp: 98 F (36.7 C) 97.3 F (36.3 C) 98.6 F (37 C) 98.2 F (36.8 C)  TempSrc: Oral Oral Oral Oral  Resp: 20 20 20 18   SpO2: 98% 98% 99% 99%   Via interpreter.  Feels well and feels comfortable with insulin injections.  General: Hispanic Spanish-speaking only male, No acute distress  HEENT: NCAT, MMM  Cardiovascular: RRR, nl S1, S2 no mrg, 2+ pulses, warm extremities  Respiratory: CTAB, no increased WOB  Abdomen: NABS, soft, NT/ND  MSK: Normal tone and bulk, no LEE, right foot with a 1-1/2-2 cm  eschar on the dorsum of the foot with some surrounding erythema extending approximately 1 cm. The area of erythema has withdrawn from the previously drawn line. Minimal induration and warmth still present.  Neuro: Grossly intact   Discharge Instructions      Discharge Orders   Future Appointments Provider Department Dept Phone   12/30/2012 11:30 AM [facility] Covering Provider [facility] Joan Flores  [phone]   Future Orders Complete By Expires     Call MD for:  difficulty breathing, headache or visual disturbances  As directed     Call MD for:  extreme fatigue  As directed     Call MD for:  hives  As directed     Call MD for:  persistant dizziness or light-headedness  As directed     Call MD for:  persistant nausea and vomiting  As directed     Call MD for:  redness, tenderness, or signs of infection (pain, swelling, redness, odor or green/yellow discharge around incision site)  As directed     Call MD for:  severe uncontrolled pain  As directed     Call MD for:  temperature >100.4  As directed     Diet Carb Modified  As directed     Discharge instructions  As directed     Comments:      You were hospitalized with cellulitis and high blood sugars. You had uncontrolled diabetes, and were started on injectable insulin. Please check your blood sugar 4 times a day, before breakfast, lunch, dinner, and bed. Please document your blood sugars at each of these times until you're able to follow up with your primary care doctor.  Please inject 14 units in the morning before breakfast and 14 units in the evening before bed.  You were started on lisinopril for high blood pressure.  Please follow up with your primary care doctor within one week of discharge.    Increase activity slowly  As directed         Medication List         aspirin 81 MG EC tablet  Take 1 tablet (81 mg total) by mouth daily.     ciprofloxacin 750 MG tablet  Commonly known as:  CIPRO  Take 1 tablet (750 mg total) by mouth 2 (two) times daily.     clindamycin 150 MG capsule  Commonly known as:  CLEOCIN  Take 3 capsules (450 mg total) by mouth 3 (three) times daily.     glucose 4 GM chewable tablet  Chew 4 tablets (16 g total) by mouth as needed for low blood sugar.     insulin NPH-regular (70-30) 100 UNIT/ML injection  Commonly known as:  NOVOLIN 70/30  Inject 14 Units into the skin 2 (two) times daily with a  meal.     INSULIN SYRINGE .3CC/31GX5/16" 31G X 5/16" 0.3 ML Misc  Insulin injection twice daily.  Diagnosis 250.00 IDDM, uncontrolled.     lisinopril 5 MG tablet  Commonly known as:  PRINIVIL,ZESTRIL  Take 1 tablet (5 mg total) by mouth daily.       Follow-up Information   Follow up with [facility]. (to make an appointment to apply for an orange card call Diane @ [phone])    Contact information:   [address] [place], [place] [phone] Appointment : December 30, 2012 at 11:30 am       The results of significant diagnostics from this hospitalization (including imaging, microbiology,  ancillary and laboratory) are listed below for reference.    Significant Diagnostic Studies: Dg Foot 2 Views Right  12/17/2012   *RADIOLOGY REPORT*  Clinical Data: Dorsal right foot soreness.  RIGHT FOOT - 2 VIEW  Comparison: None.  Findings: There is no evidence of fracture or dislocation.  The joint spaces are preserved.  There is no evidence of talar subluxation; the subtalar joint is unremarkable in appearance.  No significant soft tissue abnormalities are seen.  IMPRESSION: No evidence of fracture or dislocation.   Original Report Authenticated By: Tonia Ghent, M.D.    Microbiology: No results found for this or any previous visit (from the past 240 hour(s)).   Labs: Basic Metabolic Panel:  Recent Labs Lab 12/17/12 0410 12/17/12 1052 12/18/12 0440  NA 131*  --  138  K 3.7  --  4.6  CL 95*  --  103  CO2 24  --  28  GLUCOSE 358*  --  176*  BUN 12  --  9  CREATININE 0.72 0.54 0.66  CALCIUM 9.4  --  8.6   Liver Function Tests: No results found for this basename: AST, ALT, ALKPHOS, BILITOT, PROT, ALBUMIN,  in the last 168 hours No results found for this basename: LIPASE, AMYLASE,  in the last 168 hours No results found for this basename: AMMONIA,  in the last 168 hours CBC:  Recent Labs Lab 12/17/12 0410 12/17/12 1052 12/18/12 0440  WBC 9.7 8.0 7.0   NEUTROABS 5.7  --   --   HGB 16.3 15.0 14.4  HCT 44.3 43.5 41.2  MCV 83.4 84.6 85.8  PLT 230 208 191   Cardiac Enzymes: No results found for this basename: CKTOTAL, CKMB, CKMBINDEX, TROPONINI,  in the last 168 hours BNP: BNP (last 3 results) No results found for this basename: PROBNP,  in the last 8760 hours CBG:  Recent Labs Lab 12/18/12 0744 12/18/12 1227 12/18/12 2134 12/19/12 0809 12/19/12 1212  GLUCAP 290* 146* 134* 179* 181*    Time coordinating discharge: 45 minutes  Signed:  Peggy Loge  [facility] 12/19/2012, 12:47 PM

## 2012-12-19 NOTE — Progress Notes (Signed)
Patient watched diabetes videos 3501-510,with his son and wife who were both fluent in english,patient say he could understands english but could not express well in english.Nurse answered patient's and wife questions and son asked what exercise best fit to his father to loose his weight,r.n. Answered their questions  And patient and his family understood and expressed their goal is to control patient's diabetes to prevent further complications as from what have they learned from the diabetes videos.Diabetes caloric count and insulin administrations educations  Printed and given to the patient.

## 2012-12-30 ENCOUNTER — Ambulatory Visit: Payer: Medicaid Other | Attending: Family Medicine | Admitting: Internal Medicine

## 2012-12-30 VITALS — BP 114/78 | HR 68 | Temp 98.3°F | Resp 16 | Wt 185.6 lb

## 2012-12-30 DIAGNOSIS — I10 Essential (primary) hypertension: Secondary | ICD-10-CM | POA: Insufficient documentation

## 2012-12-30 DIAGNOSIS — E119 Type 2 diabetes mellitus without complications: Secondary | ICD-10-CM | POA: Insufficient documentation

## 2012-12-30 LAB — BASIC METABOLIC PANEL
CO2: 29 mEq/L (ref 19–32)
Calcium: 10.1 mg/dL (ref 8.4–10.5)
Glucose, Bld: 130 mg/dL — ABNORMAL HIGH (ref 70–99)
Sodium: 136 mEq/L (ref 135–145)

## 2012-12-30 NOTE — Progress Notes (Signed)
Patient ID: Arthur Cox, male   DOB: [birth year], 32 y.o.   MRN: [id number]  CC:  HPI:  Arthur Cox is a 32 y.o. male with a history of uncontrolled diabetes, who presented to the ED complaining of right foot pain. About a week ago he thinks he was bitten by a bug on his right foot, since then he began scratching that area and developed redness and swelling in the area two days ago. Last night he began having pain in his right foot that radiated into his leg. He was admitted for cellulitis of his right foot and was sent home on clindamycin and ciprofloxacin. The patient has been compliant with his antibiotics and has 2 more days of antibiotics to go. The patient has been checking his sugars evidently and they appear to be controlled between 100-120. The patient has no other complaints otherwise    No Known Allergies Past Medical History  Diagnosis Date  . Asthma   . Diabetes mellitus   . Cellulitis 12/17/2012    RIGHT FOOT   Current Outpatient Prescriptions on File Prior to Visit  Medication Sig Dispense Refill  . aspirin EC 81 MG EC tablet Take 1 tablet (81 mg total) by mouth daily.      . ciprofloxacin (CIPRO) 750 MG tablet Take 1 tablet (750 mg total) by mouth 2 (two) times daily.  8 tablet  0  . clindamycin (CLEOCIN) 150 MG capsule Take 3 capsules (450 mg total) by mouth 3 (three) times daily.  36 capsule  0  . glucose 4 GM chewable tablet Chew 4 tablets (16 g total) by mouth as needed for low blood sugar.  50 tablet  12  . insulin NPH-regular (NOVOLIN 70/30) (70-30) 100 UNIT/ML injection Inject 14 Units into the skin 2 (two) times daily with a meal.  10 mL  12  . Insulin Syringe-Needle U-100 (INSULIN SYRINGE .3CC/31GX5/16") 31G X 5/16" 0.3 ML MISC Insulin injection twice daily.  Diagnosis 250.00 IDDM, uncontrolled.  100 each  0  . lisinopril (PRINIVIL,ZESTRIL) 5 MG tablet Take 1 tablet (5 mg total) by mouth daily.  30 tablet  0   No current facility-administered medications on file prior to  visit.   No family history on file. History   Social History  . Marital Status: Single    Spouse Name: N/A    Number of Children: N/A  . Years of Education: N/A   Occupational History  . Not on file.   Social History Main Topics  . Smoking status: Never Smoker   . Smokeless tobacco: Never Used  . Alcohol Use: Yes     Comment: ONCE  A WEEK  . Drug Use: No  . Sexually Active: Not on file   Other Topics Concern  . Not on file   Social History Narrative  . No narrative on file    Review of Systems  Constitutional: Negative for fever, chills, diaphoresis, activity change, appetite change and fatigue.  HENT: Negative for ear pain, nosebleeds, congestion, facial swelling, rhinorrhea, neck pain, neck stiffness and ear discharge.   Eyes: Negative for pain, discharge, redness, itching and visual disturbance.  Respiratory: Negative for cough, choking, chest tightness, shortness of breath, wheezing and stridor.   Cardiovascular: Negative for chest pain, palpitations and leg swelling.  Gastrointestinal: Negative for abdominal distention.  Genitourinary: Negative for dysuria, urgency, frequency, hematuria, flank pain, decreased urine volume, difficulty urinating and dyspareunia.  Musculoskeletal: Negative for back pain, joint swelling, arthralgias and gait problem.  Neurological:  Negative for dizziness, tremors, seizures, syncope, facial asymmetry, speech difficulty, weakness, light-headedness, numbness and headaches.  Hematological: Negative for adenopathy. Does not bruise/bleed easily.  Psychiatric/Behavioral: Negative for hallucinations, behavioral problems, confusion, dysphoric mood, decreased concentration and agitation.    Objective:   Filed Vitals:   12/30/12 1220  BP: 114/78  Pulse: 68  Temp: 98.3 F (36.8 C)  Resp: 16    Physical Exam  Constitutional: Appears well-developed and well-nourished. No distress.  HENT: Normocephalic. External right and left ear normal.  Oropharynx is clear and moist.  Eyes: Conjunctivae and EOM are normal. PERRLA, no scleral icterus.  Neck: Normal ROM. Neck supple. No JVD. No tracheal deviation. No thyromegaly.  CVS: RRR, S1/S2 +, no murmurs, no gallops, no carotid bruit.  Pulmonary: Effort and breath sounds normal, no stridor, rhonchi, wheezes, rales.  Abdominal: Soft. BS +,  no distension, tenderness, rebound or guarding.  Musculoskeletal: Normal range of motion. No edema and no tenderness.  Lymphadenopathy: No lymphadenopathy noted, cervical, inguinal. Neuro: Alert. Normal reflexes, muscle tone coordination. No cranial nerve deficit. Skin: Skin is warm and dry. No rash noted. Not diaphoretic. No erythema. No pallor.  Psychiatric: Normal mood and affect. Behavior, judgment, thought content normal.   Lab Results  Component Value Date   WBC 7.0 12/18/2012   HGB 14.4 12/18/2012   HCT 41.2 12/18/2012   MCV 85.8 12/18/2012   PLT 191 12/18/2012   Lab Results  Component Value Date   CREATININE 0.66 12/18/2012   BUN 9 12/18/2012   NA 138 12/18/2012   K 4.6 12/18/2012   CL 103 12/18/2012   CO2 28 12/18/2012    Lab Results  Component Value Date   HGBA1C 10.9* 12/17/2012   Lipid Panel  No results found for this basename: chol, trig, hdl, cholhdl, vldl, ldlcalc       Assessment and plan:   Patient Active Problem List   Diagnosis Date Noted  . Cellulitis 12/17/2012  . Type II or unspecified type diabetes mellitus without mention of complication, uncontrolled 12/17/2012  . HTN (hypertension) 12/17/2012       Diabetes mellitus Hemoglobin A1c of 10.9 Continue current regimen of insulin and CBg controlled  Followup in 3 weeks  Hypertension continue lisinopril, BMP today  Followup in 3 weeks for diabetes check

## 2012-12-30 NOTE — Progress Notes (Signed)
Patient is a follow up from hospital Some type of bug bite to top of right foot

## 2013-01-20 ENCOUNTER — Ambulatory Visit: Payer: Self-pay

## 2013-01-22 ENCOUNTER — Ambulatory Visit: Payer: Self-pay | Attending: Internal Medicine | Admitting: Internal Medicine

## 2013-01-22 ENCOUNTER — Encounter: Payer: Self-pay | Admitting: Internal Medicine

## 2013-01-22 MED ORDER — PSEUDOEPHEDRINE-CODEINE-GG 30-10-100 MG/5ML PO SOLN: 10.0000 mL | Freq: Four times a day (QID) | ORAL | Status: DC | PRN

## 2013-01-22 MED ORDER — LEVOFLOXACIN 500 MG PO TABS: 500.0000 mg | ORAL_TABLET | Freq: Every day | ORAL | Status: DC

## 2013-01-22 NOTE — Progress Notes (Unsigned)
PT HERE FOR LAB RESULTS NASAL CONGESTION,SORE THROAT- TEMP 99.2 CBG THIS AM 115

## 2013-01-22 NOTE — Progress Notes (Unsigned)
Patient ID: Arthur Cox, male   DOB: [birth year], 32 y.o.   MRN: [id number]  CC:  HPI: Patient here for followup of his labs, discussed his lab findings, kidney function is normal, he also presents with sore throat and low-grade fever. The patient denies any chest pain any shortness of breath. He states that her CBGs were 115, 80-83 this morning   No Known Allergies Past Medical History  Diagnosis Date  . Asthma   . Diabetes mellitus   . Cellulitis 12/17/2012    RIGHT FOOT   Current Outpatient Prescriptions on File Prior to Visit  Medication Sig Dispense Refill  . aspirin EC 81 MG EC tablet Take 1 tablet (81 mg total) by mouth daily.      Marland Kitchen glucose 4 GM chewable tablet Chew 4 tablets (16 g total) by mouth as needed for low blood sugar.  50 tablet  12  . insulin NPH-regular (NOVOLIN 70/30) (70-30) 100 UNIT/ML injection Inject 14 Units into the skin 2 (two) times daily with a meal.  10 mL  12  . Insulin Syringe-Needle U-100 (INSULIN SYRINGE .3CC/31GX5/16") 31G X 5/16" 0.3 ML MISC Insulin injection twice daily.  Diagnosis 250.00 IDDM, uncontrolled.  100 each  0  . lisinopril (PRINIVIL,ZESTRIL) 5 MG tablet Take 1 tablet (5 mg total) by mouth daily.  30 tablet  0   No current facility-administered medications on file prior to visit.   History reviewed. No pertinent family history. History   Social History  . Marital Status: Single    Spouse Name: N/A    Number of Children: N/A  . Years of Education: N/A   Occupational History  . Not on file.   Social History Main Topics  . Smoking status: Never Smoker   . Smokeless tobacco: Never Used  . Alcohol Use: Yes     Comment: ONCE  A WEEK  . Drug Use: No  . Sexual Activity: Not on file   Other Topics Concern  . Not on file   Social History Narrative  . No narrative on file    Review of Systems  Constitutional: Negative for fever, chills, diaphoresis, activity change, appetite change and fatigue.  HENT: Negative for ear pain,  nosebleeds, congestion, facial swelling, rhinorrhea, neck pain, neck stiffness and ear discharge.   Eyes: Negative for pain, discharge, redness, itching and visual disturbance.  Respiratory: Negative for cough, choking, chest tightness, shortness of breath, wheezing and stridor.   Cardiovascular: Negative for chest pain, palpitations and leg swelling.  Gastrointestinal: Negative for abdominal distention.  Genitourinary: Negative for dysuria, urgency, frequency, hematuria, flank pain, decreased urine volume, difficulty urinating and dyspareunia.  Musculoskeletal: Negative for back pain, joint swelling, arthralgias and gait problem.  Neurological: Negative for dizziness, tremors, seizures, syncope, facial asymmetry, speech difficulty, weakness, light-headedness, numbness and headaches.  Hematological: Negative for adenopathy. Does not bruise/bleed easily.  Psychiatric/Behavioral: Negative for hallucinations, behavioral problems, confusion, dysphoric mood, decreased concentration and agitation.    Objective:   Filed Vitals:   01/22/13 1757  BP: 119/80  Pulse: 99  Temp: 99.2 F (37.3 C)  Resp: 18    Physical Exam  Constitutional: Appears well-developed and well-nourished. No distress.  HENT: Normocephalic. External right and left ear normal. Oropharynx is clear and moist.  Eyes: Conjunctivae and EOM are normal. PERRLA, no scleral icterus.  Neck: Normal ROM. Neck supple. No JVD. No tracheal deviation. No thyromegaly.  CVS: RRR, S1/S2 +, no murmurs, no gallops, no carotid bruit.  Pulmonary: Effort and breath sounds  normal, no stridor, rhonchi, wheezes, rales.  Abdominal: Soft. BS +,  no distension, tenderness, rebound or guarding.  Musculoskeletal: Normal range of motion. No edema and no tenderness.  Lymphadenopathy: No lymphadenopathy noted, cervical, inguinal. Neuro: Alert. Normal reflexes, muscle tone coordination. No cranial nerve deficit. Skin: Skin is warm and dry. No rash noted.  Not diaphoretic. No erythema. No pallor.  Psychiatric: Normal mood and affect. Behavior, judgment, thought content normal.   Lab Results  Component Value Date   WBC 7.0 12/18/2012   HGB 14.4 12/18/2012   HCT 41.2 12/18/2012   MCV 85.8 12/18/2012   PLT 191 12/18/2012   Lab Results  Component Value Date   CREATININE 0.69 12/30/2012   BUN 11 12/30/2012   NA 136 12/30/2012   K 4.8 12/30/2012   CL 103 12/30/2012   CO2 29 12/30/2012    Lab Results  Component Value Date   HGBA1C 10.9* 12/17/2012   Lipid Panel  No results found for this basename: chol, trig, hdl, cholhdl, vldl, ldlcalc       Assessment and plan:   Patient Active Problem List   Diagnosis Date Noted  . Cellulitis 12/17/2012  . Type II or unspecified type diabetes mellitus without mention of complication, uncontrolled 12/17/2012  . HTN (hypertension) 12/17/2012       Laryngitis Doubt that the patient has pneumonia Advised the patient to watch for elevated sugar, high grade fever, chest pain shortness of breath if any of these symptoms develop he will go to the ED Otherwise take levofloxacin for 7 days We'll prescribe him Robitussin with codeine for his cough He is asked to watch her sugar is elevated the patient with the ED Otherwise followup in 2 weeks    The patient was given clear instructions to go to ER or return to medical center if symptoms don't improve, worsen or new problems develop. The patient verbalized understanding. The patient was told to call to get any lab results if not heard anything in the next week.

## 2013-01-25 MED ORDER — GUAIFENESIN-CODEINE 100-10 MG/5ML PO SOLN: 5.0000 mL | Freq: Three times a day (TID) | ORAL | Status: DC | PRN

## 2013-02-05 ENCOUNTER — Ambulatory Visit: Payer: Self-pay

## 2013-03-01 ENCOUNTER — Emergency Department (INDEPENDENT_AMBULATORY_CARE_PROVIDER_SITE_OTHER)
Admission: EM | Admit: 2013-03-01 | Discharge: 2013-03-01 | Disposition: A | Discharge status: Home / Self Care | Payer: Self-pay | Source: Home / Self Care | Attending: Emergency Medicine | Admitting: Emergency Medicine

## 2013-03-01 ENCOUNTER — Encounter (HOSPITAL_COMMUNITY): Payer: Self-pay | Admitting: Emergency Medicine

## 2013-03-01 DIAGNOSIS — J039 Acute tonsillitis, unspecified: Secondary | ICD-10-CM

## 2013-03-01 MED ORDER — AMOXICILLIN 500 MG PO CAPS: 500.0000 mg | ORAL_CAPSULE | Freq: Three times a day (TID) | ORAL | Status: DC

## 2013-03-01 MED ORDER — PREDNISONE 20 MG PO TABS: 20.0000 mg | ORAL_TABLET | Freq: Two times a day (BID) | ORAL | Status: DC

## 2013-03-01 NOTE — ED Provider Notes (Signed)
Chief Complaint:   Chief Complaint  Patient presents with  . Sore Throat  . Fever    History of Present Illness:   Arthur Cox is a 32 year old male who's had a two-day history of sore throat, fever of up to 102, chills, aches, and has had a history of strep in the past. He denies any headache, nasal congestion, rhinorrhea, stiff neck, cough, or GI symptoms.  Review of Systems:  Other than noted above, the patient denies any of the following symptoms: Systemic:  No fevers, chills, sweats, weight loss or gain, fatigue, or tiredness. Eye:  No redness or discharge. ENT:  No ear pain, drainage, headache, nasal congestion, drainage, sinus pressure, difficulty swallowing, or sore throat. Neck:  No neck pain or swollen glands. Lungs:  No cough, sputum production, hemoptysis, wheezing, chest tightness, shortness of breath or chest pain. GI:  No abdominal pain, nausea, vomiting or diarrhea.  PMFSH:  Past medical history, family history, social history, meds, and allergies were reviewed. He is a type I diabetic and takes insulin.  Physical Exam:   Vital signs:  BP 137/96  Pulse 102  Temp(Src) 99 F (37.2 C) (Oral)  Resp 18  SpO2 100% General:  Alert and oriented.  In no distress.  Skin warm and dry. Eye:  No conjunctival injection or drainage. Lids were normal. ENT:  TMs and canals were normal, without erythema or inflammation.  Nasal mucosa was clear and uncongested, without drainage.  Mucous membranes were moist.  Tonsils were enlarged and red but no exudate.  There were no oral ulcerations or lesions. Neck:  Supple, anterior cervical nodes were enlarged and tender. Lungs:  No respiratory distress.  Lungs were clear to auscultation, without wheezes, rales or rhonchi.  Breath sounds were clear and equal bilaterally.  Heart:  Regular rhythm, without gallops, murmers or rubs. Skin:  Clear, warm, and dry, without rash or lesions.  Labs:   Results for orders placed during the hospital  encounter of 03/01/13  POCT RAPID STREP A ([facility]ARE ONLY)      Result Value Range   Streptococcus, Group A Screen (Direct) NEGATIVE  NEGATIVE    Assessment:  The encounter diagnosis was Tonsillitis.  No evidence of peritonsillar abscess.  Plan:   1.  Meds:  The following meds were prescribed:   Discharge Medication List as of 03/01/2013 11:15 AM    START taking these medications   Details  amoxicillin (AMOXIL) 500 MG capsule Take 1 capsule (500 mg total) by mouth 3 (three) times daily., Starting 03/01/2013, Until Discontinued, Normal    predniSONE (DELTASONE) 20 MG tablet Take 1 tablet (20 mg total) by mouth 2 (two) times daily., Starting 03/01/2013, Until Discontinued, Normal        2.  Patient Education/Counseling:  The patient was given appropriate handouts, self care instructions, and instructed in symptomatic relief.  Suggested hot saline gargles and throat lozenges.  3.  Follow up:  The patient was told to follow up if no better in 3 to 4 days, if becoming worse in any way, and given some red flag symptoms such as persisting fever or worsening pain, or any difficulty swallowing or breathing which would prompt immediate return.  Follow up here if necessary.      Reuben Likes, MD 03/01/13 [phone]

## 2013-03-01 NOTE — ED Notes (Signed)
C/o fever and sore throat for two days now.  Patient has taken ibuprofen but no relief.

## 2013-03-03 LAB — CULTURE, GROUP A STREP

## 2013-03-03 NOTE — ED Notes (Signed)
Throat culture: Group A strep (S. Pyogenes).  Pt. adequately treated with Amoxicillin. Arthur Cox 03/03/2013

## 2013-03-05 ENCOUNTER — Telehealth (HOSPITAL_COMMUNITY): Payer: Self-pay | Admitting: *Deleted

## 2013-03-05 NOTE — ED Notes (Addendum)
I called pt.'s number and it said 1 to be connected. It connected to [employer] service.  He did not know this person.  I called contacts number and left a mesage to call.  [name] called back and said Arthur Cox was his nephew. He will try to call him and ask him to call me. [name] 03/05/2013 Pt. called back.  Pt.'s English is limited. Pt. told he had strep throat and to finish all of his medicine. Pt. voiced understanding.

## 2014-12-28 ENCOUNTER — Ambulatory Visit (INDEPENDENT_AMBULATORY_CARE_PROVIDER_SITE_OTHER): Payer: Self-pay | Admitting: Family Medicine

## 2014-12-28 VITALS — BP 115/80 | HR 83 | Temp 98.6°F | Resp 18 | Ht 65.0 in | Wt 194.0 lb

## 2014-12-28 DIAGNOSIS — Z23 Encounter for immunization: Secondary | ICD-10-CM

## 2014-12-28 DIAGNOSIS — S61412A Laceration without foreign body of left hand, initial encounter: Secondary | ICD-10-CM

## 2014-12-28 DIAGNOSIS — M79642 Pain in left hand: Secondary | ICD-10-CM

## 2014-12-28 MED ORDER — HYDROCODONE-ACETAMINOPHEN 5-325 MG PO TABS: 1.0000 | ORAL_TABLET | Freq: Three times a day (TID) | ORAL | Status: DC | PRN

## 2014-12-28 MED ORDER — CEPHALEXIN 500 MG PO CAPS: 500.0000 mg | ORAL_CAPSULE | Freq: Four times a day (QID) | ORAL | Status: DC

## 2014-12-28 NOTE — Progress Notes (Signed)
Procedure: Risk and benefits discussed and verbal consent obtained. The patient was anesthetized using 5 cc of 2% lidocaine with epinephrine. The wound was scrubbed with soap and water. Sterile prep and drape. The wound was closed with 3-0 Ethilon.  The wound was then cleaned with water and a bandage was applied. Patient instructed to come back for suture removal in 10  days.

## 2014-12-28 NOTE — Patient Instructions (Addendum)
Cuidados de una laceración - Adultos  °(Laceration Care, Adult) ° Una herida cortante es un corte o lesión que atraviesa todas las capas de la piel y el tejido que se encuentra debajo de la piel.  °TRATAMIENTO  °Algunas laceraciones no requieren sutura. Algunas no deben cerrarse debido a que puede aumentar el riesgo de infección. Es importante que consulte al médico lo antes posible después de recibir una lesión para minimizar el riesgo de infección y aumentar la posibilidad de que se cierre con éxito.  °Cuando se cierra adecuadamente, podrán indicarle analgésicos, si los necesita. La herida debe limpiarse para combatir la infección. El médico usará puntos (suturas), grapas,adhesivo, o tiras adhesivas para reparar la laceración. Estos elementos mantendrán unidos los bordes de la piel para que se cure más rápidamente y para un mejor resultado cosmético. Sin embargo, todas las heridas se curarán con una cicatriz. Una vez que la herida se haya curado, las cicatrices pueden minimizarse cubriendo la herida con pantalla solar durante el día por un lapso se 1 año.  °INSTRUCCIONES PARA EL CUIDADO EN EL HOGAR  °Si tiene puntos o grapas:  °· Mantenga la herida limpia y seca. °· Si tiene un (vendaje) cámbielo al menos una vez al día. Cámbielo si se moja o se ensucia, o según las indicaciones del médico. °· Lave el corte dos veces por día con agua y jabón. Enjuáguelo con agua para quitar todo el jabón. Seque dando palmaditas con una toalla limpia y seca. °· Después de limpiar, aplique una delgada capa de una crema con antibiótico según las indicaciones del médico. Esto le ayudará a prevenir las infecciones y a evitar que el vendaje se adhiera. °· Puede ducharse después de las primeras 24 horas. No remoje la herida en agua hasta que le hayan quitado los puntos. °· Solo tome medicamentos que se pueden comprar sin receta o recetados para el dolor, malestar o fiebre, como le indica el médico. °· Concurra para que le retiren los  puntos o las grapas cuando el médico le indique. °En caso que tenga tiras adhesivas:  °· Mantenga la herida limpia y seca. °· No deje que las tiras se mojen. Puede darse un baño cuidando de mantener la herida seca. °· Si se moja, séquela dando palmaditas con una toalla limpia. °· Las tiras caerán por sí mismas. Puede recortar las tiras a medida que la herida se cura. No quite las tiras que están pegadas a la herida. Ellas se caerán cuando sea el momento. °En caso que le hayan aplicado adhesivo.  °· Podrá mojara momentáneamente la herida en la ducha o el baño. No frote ni sumerja la herida. No practique natación. Evite transpirar con abundancia hasta que el adhesivo se haya caído. Después de ducharse o darse un baño, seque el corte dando palmaditas con una toalla limpia. °· No aplique medicamentos líquidos, en crema o ungüentos mientras el adhesivo esté en su lugar. Podrá aflojarlo antes de que la herida se cure. °· Si tiene un vendaje, tenga cuidado de no aplicar cinta adhesiva directamente sobre el adhesivo. Esto puede hacer que el adhesivo se caiga antes de que la herida se haya curado. °· Evite la exposición prolongada a la luz del sol o a la lámpara solar mientras en adhesivo se encuentre en el lugar. La exposición a los rayos ultravioletas durante el primer año oscurecerá la cicatriz. °· El adhesivo permanecerá sobre la piel durante 5 a 10 días y luego caerá naturalmente. No quite la película de adhesivo. °Deberá aplicarse   la vacuna contra el tétanos si: °· No recuerda cuándo se colocó la vacuna la última vez. °· Nunca recibió esta vacuna. °Si le han aplicado la vacuna contra el tétanos, el brazo podrá hincharse, enrojecer y sentirse caliente al tacto. Esto es frecuente y no es un problema. Si usted necesita aplicarse la vacuna y se niega a recibirla, corre riesgo de contraer tétanos. Ésta es una enfermedad grave.  °SOLICITE ATENCIÓN MÉDICA SI:  °· Presenta enrojecimiento, hinchazón o aumento del dolor en la  herida. °· Hay rayas rojas que salen de la herida. °· Observa un líquido blanco amarillento (pus) en la herida. °· Tiene fiebre. °· Advierte un olor fétido que proviene de la herida o del vendaje. °· La herida se abre luego de que le han extraído las suturas. °· Nota que en la herida hay algún cuerpo extraño como un trozo de madera o vidrio. °· La herida está en su mano o pie y observa que no puede mover correctamente los dedos. °SOLICITE ATENCIÓN MÉDICA DE INMEDIATO SI:  °· El dolor no se alivia con los medicamentos. °· Hay una zona muy hinchada alrededor de la herida que le causa dolor y adormecimiento, o advierte un cambio en el color en el brazo, la mano, la pierna o el pie. °· La herida se abre y sangra nuevamente. °· Siente que el adormecimiento, la debilidad o la pérdida de la función de la articulación que rodea la herida empeoran. °· Palpa nódulos dolorosos cerca de la herida o bajo la piel en cualquier zona del cuerpo. °ASEGÚRESE DE QUE:  °· Comprende estas instrucciones. °· Controlará su enfermedad. °· Solicitará ayuda de inmediato si no mejora o si empeora. °Document Released: 05/20/2005 Document Revised: 08/12/2011 °ExitCare® Patient Information ©2015 [employer]. This information is not intended to replace advice given to you by your health care provider. Make sure you discuss any questions you have with your health care provider. ° °

## 2014-12-28 NOTE — Progress Notes (Signed)
Chief Complaint:  Chief Complaint  Patient presents with  . Laceration    left hand, today, WC    HPI: Arthur Cox is a 34 y.o. male who reports to [facility] today complaining of left hand laceration along the  thenar eminence 2 hours ago while at work. He slashed it with his knife. He is not sure when his last tetanus shot was. He has diabetes.he has a history of prior cellulitis. His last A1c that we have records of this 10.2.a lot of pain. Does not remember his last tetanus, Has DM and hx of cellulitis  Past Medical History  Diagnosis Date  . Asthma   . Diabetes mellitus   . Cellulitis 12/17/2012    RIGHT FOOT   Past Surgical History  Procedure Laterality Date  . No past surgeries     History   Social History  . Marital Status: Married    Spouse Name: N/A  . Number of Children: N/A  . Years of Education: N/A   Social History Main Topics  . Smoking status: Never Smoker   . Smokeless tobacco: Never Used  . Alcohol Use: Yes     Comment: ONCE  A WEEK  . Drug Use: No  . Sexual Activity: Not on file   Other Topics Concern  . None   Social History Narrative   No family history on file. No Known Allergies   ROS: The patient denies fevers, chills, night sweats, unintentional weight loss, chest pain, palpitations, wheezing, dyspnea on exertion, nausea, vomiting, abdominal pain, dysuria, hematuria, melena, numbness, weakness,  All other systems have been reviewed and were otherwise negative with the exception of those mentioned in the HPI and as above.    PHYSICAL EXAM: Filed Vitals:   12/28/14 1725  BP: 115/80  Pulse: 83  Temp: 98.6 F (37 C)  Resp: 18   Body mass index is 32.28 kg/(m^2).   General: Alert, no acute distress HEENT:  Normocephalic, atraumatic, oropharynx patent. EOMI, PERRLA Cardiovascular:  Regular rate and rhythm, no rubs murmurs or gallops.   radial pulse intact. No pedal edema.  Respiratory: Clear to auscultation bilaterally.   No wheezes, rales, or rhonchi.  No cyanosis, no use of accessory musculature Abdominal: No organomegaly, abdomen is soft and non-tender, positive bowel sounds. No masses. Skin: + laceration thenar eminence 1.5 inch, no tendon involvement , neurovascularly intact Neurologic: Facial musculature symmetric. Psychiatric: Patient acts appropriately throughout our interaction. Lymphatic: No cervical lymphadenopathy Musculoskeletal: Gait intact. No edema, tenderness 5/5 grip strength, interosseus  LABS: Lab Results  Component Value Date   HGBA1C 10.9* 12/17/2012   HGBA1C 8.5%+ 08/24/2011   Lab Results  Component Value Date   CREATININE 0.69 12/30/2012     EKG/XRAY:   Primary read interpreted by Dr. Conley Rolls at [facility].   ASSESSMENT/PLAN: Encounter Diagnoses  Name Primary?  . Hand laceration, left, initial encounter Yes  . Need for prophylactic vaccination with tetanus-diphtheria (TD)     The patient was given a tetanus vaccine today.  Wound care instructions were given, follow-up instructions were given by Deliah Boston , PA-C  A prescription for Keflex and Norco were also given  Has significant uncontrolled diabetes and I have advised him that he really needs to follow-up with a primary care provider either here or an urgent care somewhere else to get his diabetes under controlled. The last record of his hemoglobin A1c was last year and it was 10.2. He has not been on any  medications. We discussed in length the complications of wound healing in the setting of uncontrolled diabetes.   Gross sideeffects, risk and benefits, and alternatives of medications d/w patient. Patient is aware that all medications have potential sideeffects and we are unable to predict every sideeffect or drug-drug interaction that may occur.  Ivorie Uplinger DO  12/28/2014 6:34 PM

## 2015-01-07 ENCOUNTER — Ambulatory Visit (INDEPENDENT_AMBULATORY_CARE_PROVIDER_SITE_OTHER): Payer: Self-pay | Admitting: Emergency Medicine

## 2015-01-07 VITALS — BP 124/68 | HR 105 | Temp 98.4°F | Resp 16 | Ht 65.0 in | Wt 194.0 lb

## 2015-01-07 DIAGNOSIS — S61012D Laceration without foreign body of left thumb without damage to nail, subsequent encounter: Secondary | ICD-10-CM

## 2015-01-07 DIAGNOSIS — S61002D Unspecified open wound of left thumb without damage to nail, subsequent encounter: Secondary | ICD-10-CM

## 2015-01-07 NOTE — Patient Instructions (Signed)

## 2015-01-07 NOTE — Progress Notes (Signed)
   Subjective:  Patient ID: Arthur Cox, male    DOB: Dec 24, 1980  Age: 34 y.o. MRN: [id number]  CC: Suture / Staple Removal   HPI KENDLE ERKER presents  following work-related injury where his lacerated left thumb by a knife. He has a healing laceration been repaired 10 days ago. He has no neurologic or tendon complaint.  History  Past medical family and social history noncontributory  Review of Systems   Review of systems are noncontributory  Objective:  BP 124/68 mmHg  Pulse 105  Temp(Src) 98.4 F (36.9 C) (Oral)  Resp 16  Ht  (1.651 m)  Wt 194 lb (87.998 kg)  BMI 32.28 kg/m2  SpO2 98%  Physical Exam  Constitutional: He is oriented to person, place, and time. He appears well-developed and well-nourished.  HENT:  Head: Normocephalic and atraumatic.  Eyes: Conjunctivae are normal. Pupils are equal, round, and reactive to light.  Pulmonary/Chest: Effort normal.  Musculoskeletal: He exhibits no edema.  Neurological: He is alert and oriented to person, place, and time.  Skin: Skin is dry.  Psychiatric: He has a normal mood and affect. His behavior is normal. Thought content normal.   his laceration of THUMB IS HEALED COMPLETELY THERE IS NO EVIDENCE OF NEUROVASCULAR TENDON INJURY OF FOREIGN BODY    Assessment & Plan:   Lindon was seen today for suture / staple removal.  Diagnoses and all orders for this visit:  Laceration of thumb, left, subsequent encounter  I am having Mr. Sosa-Parada maintain his aspirin, lisinopril, insulin NPH-regular Human, INSULIN SYRINGE .3CC/31GX5/16", glucose, pseudoephedrine-codeine-guaifenesin, guaiFENesin-codeine, predniSONE, cephALEXin, and HYDROcodone-acetaminophen.  No orders of the defined types were placed in this encounter.   His sutures were removed atraumatically with no complications. He was returned to full duty without restriction  Appropriate red flag conditions were discussed with the patient as well as  actions that should be taken.  Patient expressed his understanding.  Follow-up: Return if symptoms worsen or fail to improve.  Carmelina Dane, MD

## 2015-01-13 ENCOUNTER — Ambulatory Visit (INDEPENDENT_AMBULATORY_CARE_PROVIDER_SITE_OTHER): Payer: Self-pay | Admitting: Family Medicine

## 2015-01-13 VITALS — BP 142/84 | HR 95 | Temp 98.6°F | Resp 14 | Ht 65.0 in | Wt 191.0 lb

## 2015-01-13 DIAGNOSIS — M79642 Pain in left hand: Secondary | ICD-10-CM

## 2015-01-13 DIAGNOSIS — S61002D Unspecified open wound of left thumb without damage to nail, subsequent encounter: Secondary | ICD-10-CM

## 2015-01-13 DIAGNOSIS — S61012D Laceration without foreign body of left thumb without damage to nail, subsequent encounter: Secondary | ICD-10-CM

## 2015-01-13 NOTE — Progress Notes (Signed)
Arthur Cox 1980-08-18 34 y.o.   Chief Complaint  Patient presents with  . Follow-up    workers comp f/u     Date of Injury: 12/28/2014  History of Present Illness:  Presents for evaluation of work-related complaint.  Patient is here today for follow up of left hand pain and swelling that started today.  He was seen here at [facility] for a left hand laceration at the thenar.  The sutures removed 6 days ago without complication or trauma.  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  He has noticed that today he had increased swelling and pain over the laceration today.  He has noted paint and dirt that has landed onto this closed wound.  He has pain with working with his tools during work.  He is right hand dominant.  There is no noticeable warmth, drainage, or fever at this time.    ROS ROS otherwise unremarkable unless listed above.     No Known Allergies   Current medications reviewed and updated. Past medical history, family history, social history have been reviewed and updated.   Physical Exam  Constitutional: He is oriented to person, place, and time and well-developed, well-nourished, and in no distress. No distress.  HENT:  Head: Normocephalic and atraumatic.  Cardiovascular: Normal rate.   Musculoskeletal:  Left hand with horizontal wound at thenar.  Mild erythema localized to the wound site without much tenderness.  No warmth or drainage expressed.  Consistent with healing wound.  He has normal range of motion and strength with resistant movement and opposition.  Normal sensation throughout.  Good capillary refill and radial pulse.  Neurological: He is alert and oriented to person, place, and time.  Skin: Skin is warm and dry. He is not diaphoretic.  Psychiatric: Affect and judgment normal.     Assessment and Plan: 34 year old male is here today for chief complaint of left hand pain and swelling.  This appears to be healing well, and  discomfort is synonymous to a healing laceration at this location.  I will have restriction for 3 days for rest and NSAID, ice.  He will return in 3 days for re-evaluation, and will likely be able to return to work without restriction.    Laceration of thumb, left, subsequent encounter  Left hand pain  Trena Platt, PA-C [facility] 8/12/20167:36 PM

## 2015-01-13 NOTE — Patient Instructions (Addendum)
We will reevaluate your right hand in 3 days.  Please keep the hand clean with soap and water.  You can use ibuprofen  every 6 hours as needed.

## 2015-01-14 NOTE — Progress Notes (Signed)
Discuss treatment plan with Trena Platt, PA-C. The patient's wound has continued to cause pain. Agree with restricting use for a few days, and recommend that the job place provide alternative daily.

## 2015-01-16 ENCOUNTER — Ambulatory Visit (INDEPENDENT_AMBULATORY_CARE_PROVIDER_SITE_OTHER): Payer: Self-pay | Admitting: Family Medicine

## 2015-01-16 VITALS — BP 122/80 | HR 90 | Temp 98.2°F | Resp 18 | Ht 65.0 in | Wt 190.0 lb

## 2015-01-16 DIAGNOSIS — S61002D Unspecified open wound of left thumb without damage to nail, subsequent encounter: Secondary | ICD-10-CM

## 2015-01-16 DIAGNOSIS — S61012D Laceration without foreign body of left thumb without damage to nail, subsequent encounter: Secondary | ICD-10-CM | POA: Diagnosis not present

## 2015-01-16 DIAGNOSIS — M79642 Pain in left hand: Secondary | ICD-10-CM | POA: Diagnosis not present

## 2015-01-16 NOTE — Progress Notes (Signed)
Chief Complaint:  Chief Complaint  Patient presents with  . Follow-up    W/C left thumb    HPI: Arthur Cox is a 34 y.o. 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 He has moderate pain when carrying heavy things  Or when he is pulling his thumb backwards for any reason.  He had an appotinment with diabetes doctor in March 2015  Arthur Cox but has not been to see him since and is not on any meds for his DM.    Lab Results  Component Value Date   HGBA1C 10.9* 12/17/2012   HGBA1C 8.5%+ 08/24/2011   Lab Results  Component Value Date   CREATININE 0.69 12/30/2012     ROS: The patient denies fevers, chills, night sweats, unintentional weight loss, chest pain, palpitations, wheezing, dyspnea on exertion, nausea, vomiting, abdominal pain, dysuria, hematuria, melena, numbness, weakness, or tingling.   All other systems have been reviewed and were otherwise negative with the exception of those mentioned in the HPI and as above.    PHYSICAL EXAM: Filed Vitals:   01/16/15 1014  BP: 122/80  Pulse: 90  Temp: 98.2 F (36.8 C)  Resp: 18   Body mass index is 31.62 kg/(m^2).   General: Alert, no acute distress HEENT:  Normocephalic, atraumatic, oropharynx patent. EOMI, PERRLA Cardiovascular:  Regular rate and rhythm, no rubs murmurs or gallops. Respiratory: Clear to auscultation bilaterally.  No wheezes, rales, or rhonchi.  No cyanosis, no use of accessory musculature Abdominal: No organomegaly, abdomen is soft and non-tender, positive bowel sounds. No masses. Skin: No rashes. Neurologic: Facial musculature symmetric. Psychiatric: Patient acts appropriately throughout our interaction. Lymphatic: No cervical or  submandibular lymphadenopathy Musculoskeletal: Gait intact. No edema, tenderness Left thumb- wound is completely healed, clean, no dc, no erythema, no  noninfected appearing He has pain of thumb with extension, nontender on palpation, he has full ROM 5/5 strength,sensationintact + radial pulse  LABS: Results for orders placed or performed during the hospital encounter of 03/01/13  Culture, Group A Strep  Result Value Ref Range   Specimen Description THROAT    Special Requests NONE    Culture      GROUP A STREP (S.PYOGENES) ISOLATED Performed at [facility]   Report Status 03/03/2013 FINAL   POCT rapid strep A [facility])  Result Value Ref Range   Streptococcus, Group A Screen (Direct) NEGATIVE NEGATIVE     EKG/XRAY:   Primary read interpreted by Dr. Conley Rolls at [facility].   ASSESSMENT/PLAN: Encounter Diagnoses  Name Primary?  . Laceration of thumb, left, subsequent encounter Yes  . Left hand pain    His wound is healed, he has some scar tissue which I think is causing pain, I have offered to him if he wants to go to the [facility] for [facility] PT since he does have pain with certain ROM. He declined/  I have asked him to do ROM with his thumb so he can break the fibrous scar tissue so it does nto scar down too much.  He is a diabetic and we talked about wound healing in the setting of uncontrolled diabetes and have asked him to see his PCP or  return to our office without WC to address this issue.  Fu in 4 weeks, tylenol, warm compresses, NSAIDs prn pain  Gross sideeffects, risk and benefits, and alternatives of medications d/w patient. Patient is aware that all medications have potential sideeffects and we are unable to predict every sideeffect or drug-drug interaction that may occur.  Suesan Mohrmann DO  01/19/2015 10:17 AM

## 2015-06-14 ENCOUNTER — Ambulatory Visit (INDEPENDENT_AMBULATORY_CARE_PROVIDER_SITE_OTHER): Payer: Self-pay | Admitting: Family Medicine

## 2015-06-14 VITALS — BP 132/84 | HR 86 | Temp 98.4°F | Resp 16 | Ht 64.5 in | Wt 184.8 lb

## 2015-06-14 DIAGNOSIS — R1013 Epigastric pain: Secondary | ICD-10-CM

## 2015-06-14 NOTE — Progress Notes (Signed)
   Subjective:    Patient ID: Arthur Cox, male    DOB: 07/28/1980, 35 y.o.   MRN: [id number] By signing my name below, I, [name], attest that this documentation has been prepared under the direction and in the presence of Elvina SidleKurt Helio Lack, MD. Electronically Signed: [name], ER Scribe. 06/14/2015. 7:26 PM.  Chief Complaint  Patient presents with  . Emesis  . Abdominal Pain    x 3 days, usually in morning    The pt speaks very little english, spanish was spoken in the room.   HPI HPI Comments: Arthur Cox is a 35 y.o. male who [facility] complaining of abdominal pain and vomiting for the last three days. The abdominal pain happens in the afternoon. The pain does not happen after eating. 2-3 hours after eating he also vomits. He does not drink a large amount of alcohol. He is able to sleep normally.   Past Medical History  Diagnosis Date  . Asthma   . Diabetes mellitus   . Cellulitis 12/17/2012    RIGHT FOOT   No current outpatient prescriptions on file prior to visit.   No current facility-administered medications on file prior to visit.    Review of Systems  Constitutional: Negative for fever and chills.  Gastrointestinal: Positive for vomiting and abdominal pain.  Musculoskeletal: Negative for myalgias.      Objective:  BP 132/84 mmHg  Pulse 86  Temp(Src) 98.4 F (36.9 C) (Oral)  Resp 16  Ht 5' 4.5" (1.638 m)  Wt 184 lb 12.8 oz (83.825 kg)  BMI 31.24 kg/m2  SpO2 98%  Physical Exam  Constitutional: He is oriented to person, place, and time. He appears well-developed and well-nourished. No distress.  HENT:  Head: Normocephalic and atraumatic.  Eyes: Pupils are equal, round, and reactive to light.  Neck: Neck supple.  Cardiovascular: Normal rate.   Pulmonary/Chest: Effort normal. No respiratory distress.  Musculoskeletal: Normal range of motion.  Neurological: He is alert and oriented to person, place, and time. Coordination  normal.  Skin: Skin is warm and dry. He is not diaphoretic.  Psychiatric: He has a normal mood and affect. His behavior is normal.  Nursing note and vitals reviewed.     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 He has a history of elevated A1c consistent with diabetes. It's possible this is coming from uncontrolled diabetes. He has no money so I am electing to treat him with some samples of Nexium for the next couple days and told him that if he has no improvement that we will proceed with further testing.  He does not appear to be acutely on he has minimal tenderness in the epigastrium.   This chart was scribed in my presence and reviewed by me personally.    ICD-9-CM ICD-10-CM   1. Abdominal pain, epigastric 789.06 R10.13    Return if not improved in the next 48 hours  Signed, Elvina SidleKurt Akirra Lacerda, MD

## 2015-06-19 ENCOUNTER — Ambulatory Visit
Admission: RE | Admit: 2015-06-19 | Discharge: 2015-06-19 | Disposition: A | Discharge status: Home / Self Care | Payer: No Typology Code available for payment source | Source: Ambulatory Visit | Attending: Infectious Disease | Admitting: Infectious Disease

## 2015-06-19 ENCOUNTER — Other Ambulatory Visit: Payer: Self-pay | Admitting: Infectious Disease

## 2015-06-19 DIAGNOSIS — R7611 Nonspecific reaction to tuberculin skin test without active tuberculosis: Secondary | ICD-10-CM

## 2015-07-21 ENCOUNTER — Ambulatory Visit
Admission: RE | Admit: 2015-07-21 | Discharge: 2015-07-21 | Disposition: A | Discharge status: Home / Self Care | Payer: No Typology Code available for payment source | Source: Ambulatory Visit | Attending: Infectious Disease | Admitting: Infectious Disease

## 2015-07-21 ENCOUNTER — Other Ambulatory Visit: Payer: Self-pay | Admitting: Infectious Disease

## 2015-07-21 DIAGNOSIS — R7611 Nonspecific reaction to tuberculin skin test without active tuberculosis: Secondary | ICD-10-CM

## 2015-08-11 ENCOUNTER — Ambulatory Visit
Admission: RE | Admit: 2015-08-11 | Discharge: 2015-08-11 | Disposition: A | Discharge status: Home / Self Care | Payer: No Typology Code available for payment source | Source: Ambulatory Visit | Attending: Infectious Disease | Admitting: Infectious Disease

## 2015-08-11 ENCOUNTER — Other Ambulatory Visit: Payer: Self-pay | Admitting: Infectious Disease

## 2015-08-11 DIAGNOSIS — R7611 Nonspecific reaction to tuberculin skin test without active tuberculosis: Secondary | ICD-10-CM

## 2016-03-17 IMAGING — CR DG CHEST 2V
2 series · 2 of 2 positions shown · non-contrast
Comparison: Chest x-ray of June 19, 2015.

CLINICAL DATA: Positive PPD, cough

EXAM:
CHEST  2 VIEW

[w chest pa]
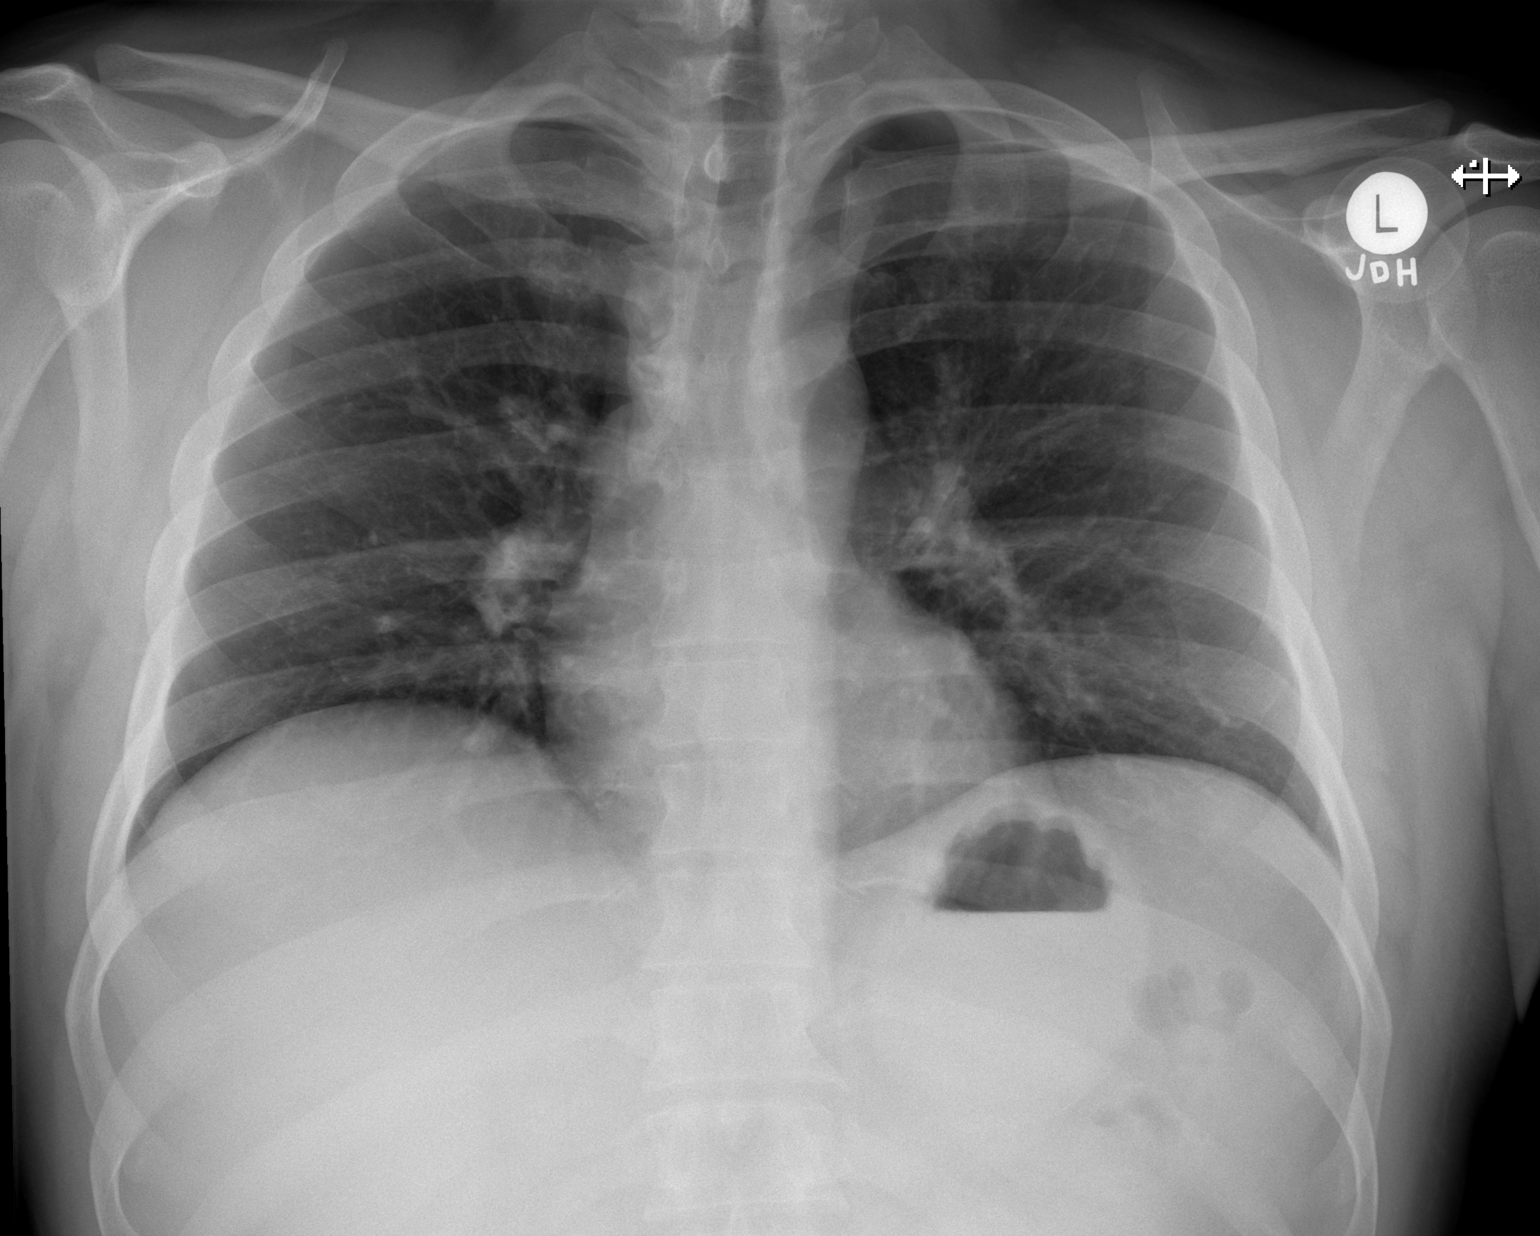

[w chest lat]
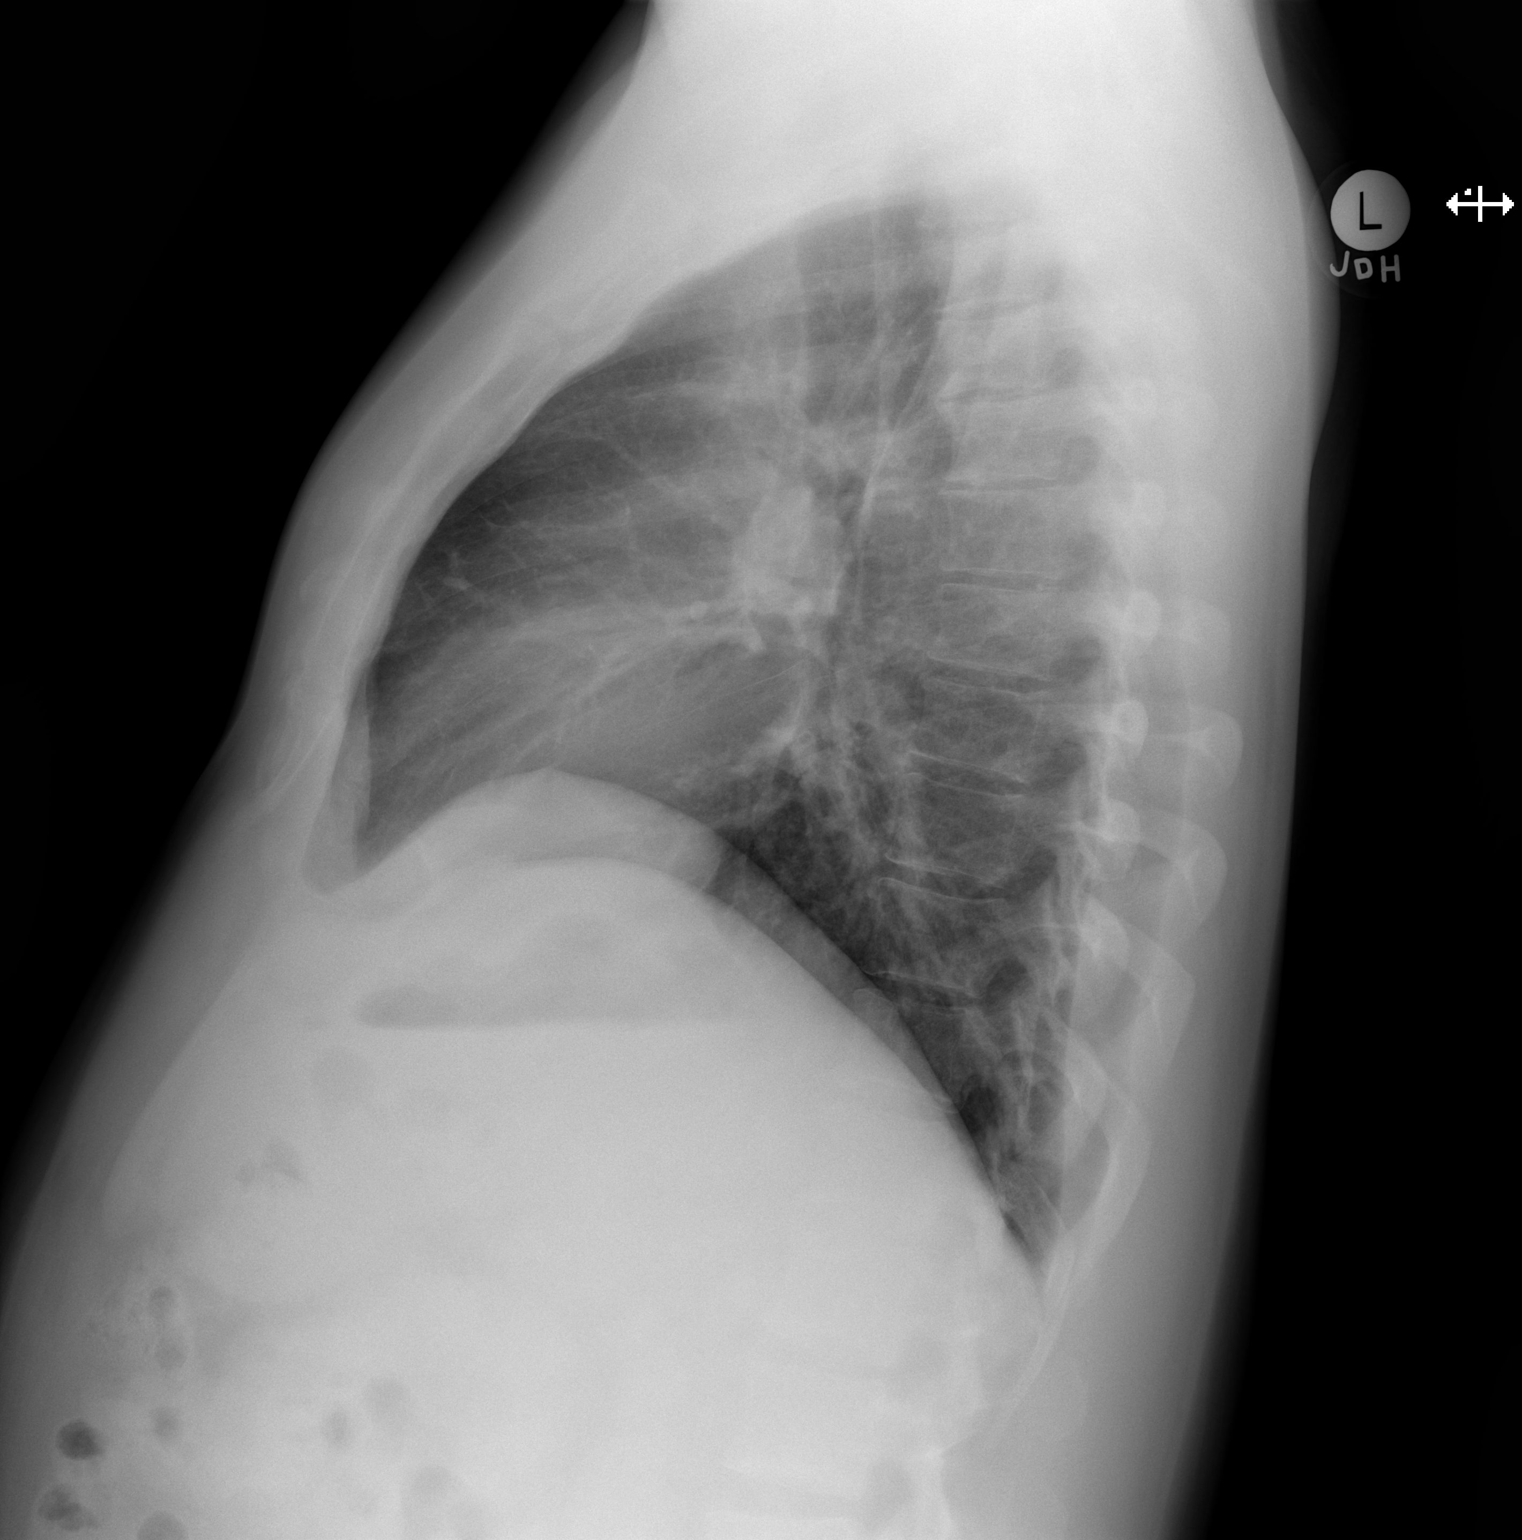

[2 of 2 positions shown; findings below may reference images not displayed]

FINDINGS: The lungs are mildly hypoinflated. The perihilar lung markings are
slightly less conspicuous bilaterally but remain prominent. There
are no air bronchograms. There is no pleural effusion. The trachea
is midline. The bony thorax exhibits no acute abnormality.
IMPRESSION: Persistent mild hypo inflation limits the study. There remain mildly
increased perihilar lung markings which overall are slightly less
conspicuous today.

A repeat PA and lateral chest X ray with deep inspiration would be
useful to further evaluate the perihilar regions.

## 2016-04-07 IMAGING — CR DG CHEST 2V
2 series · 2 of 2 positions shown · non-contrast
Comparison: 07/21/2015.

CLINICAL DATA: Positive PPD.

EXAM:
CHEST  2 VIEW

[w chest pa]
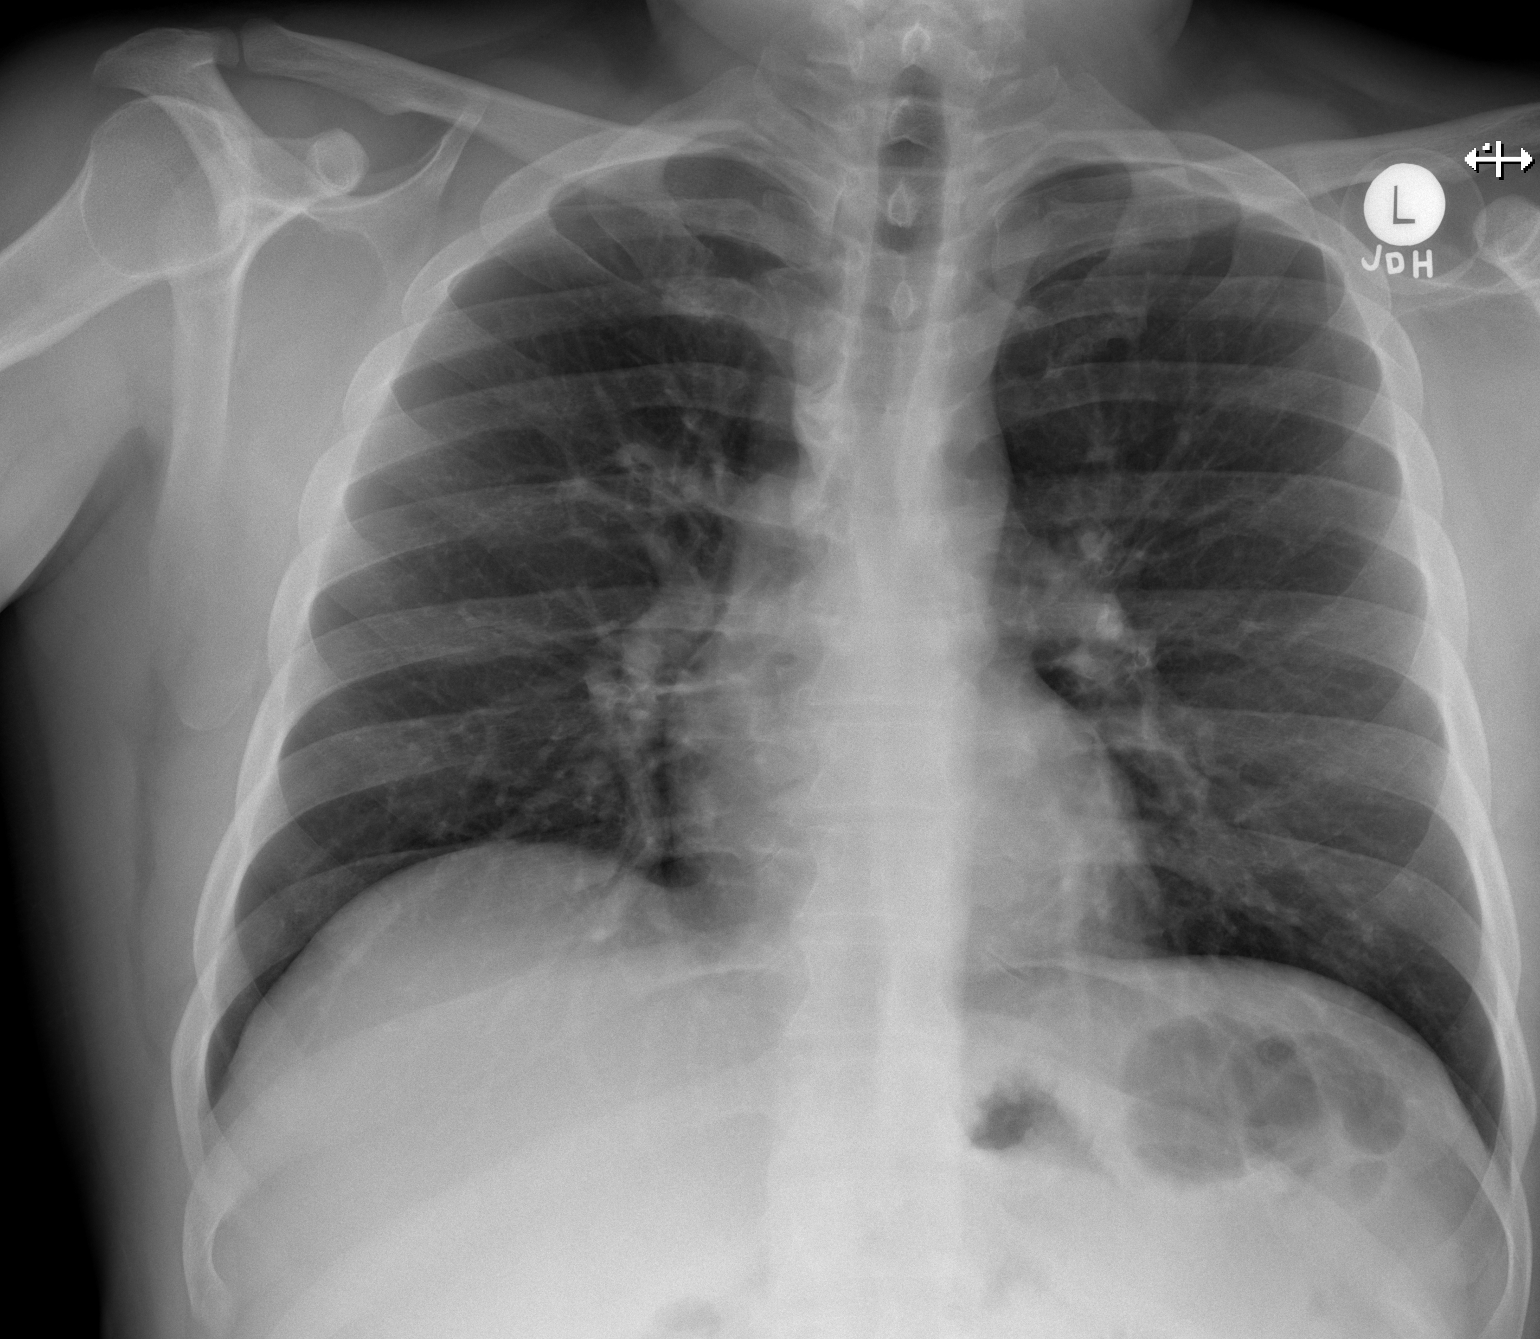

[w chest lat]
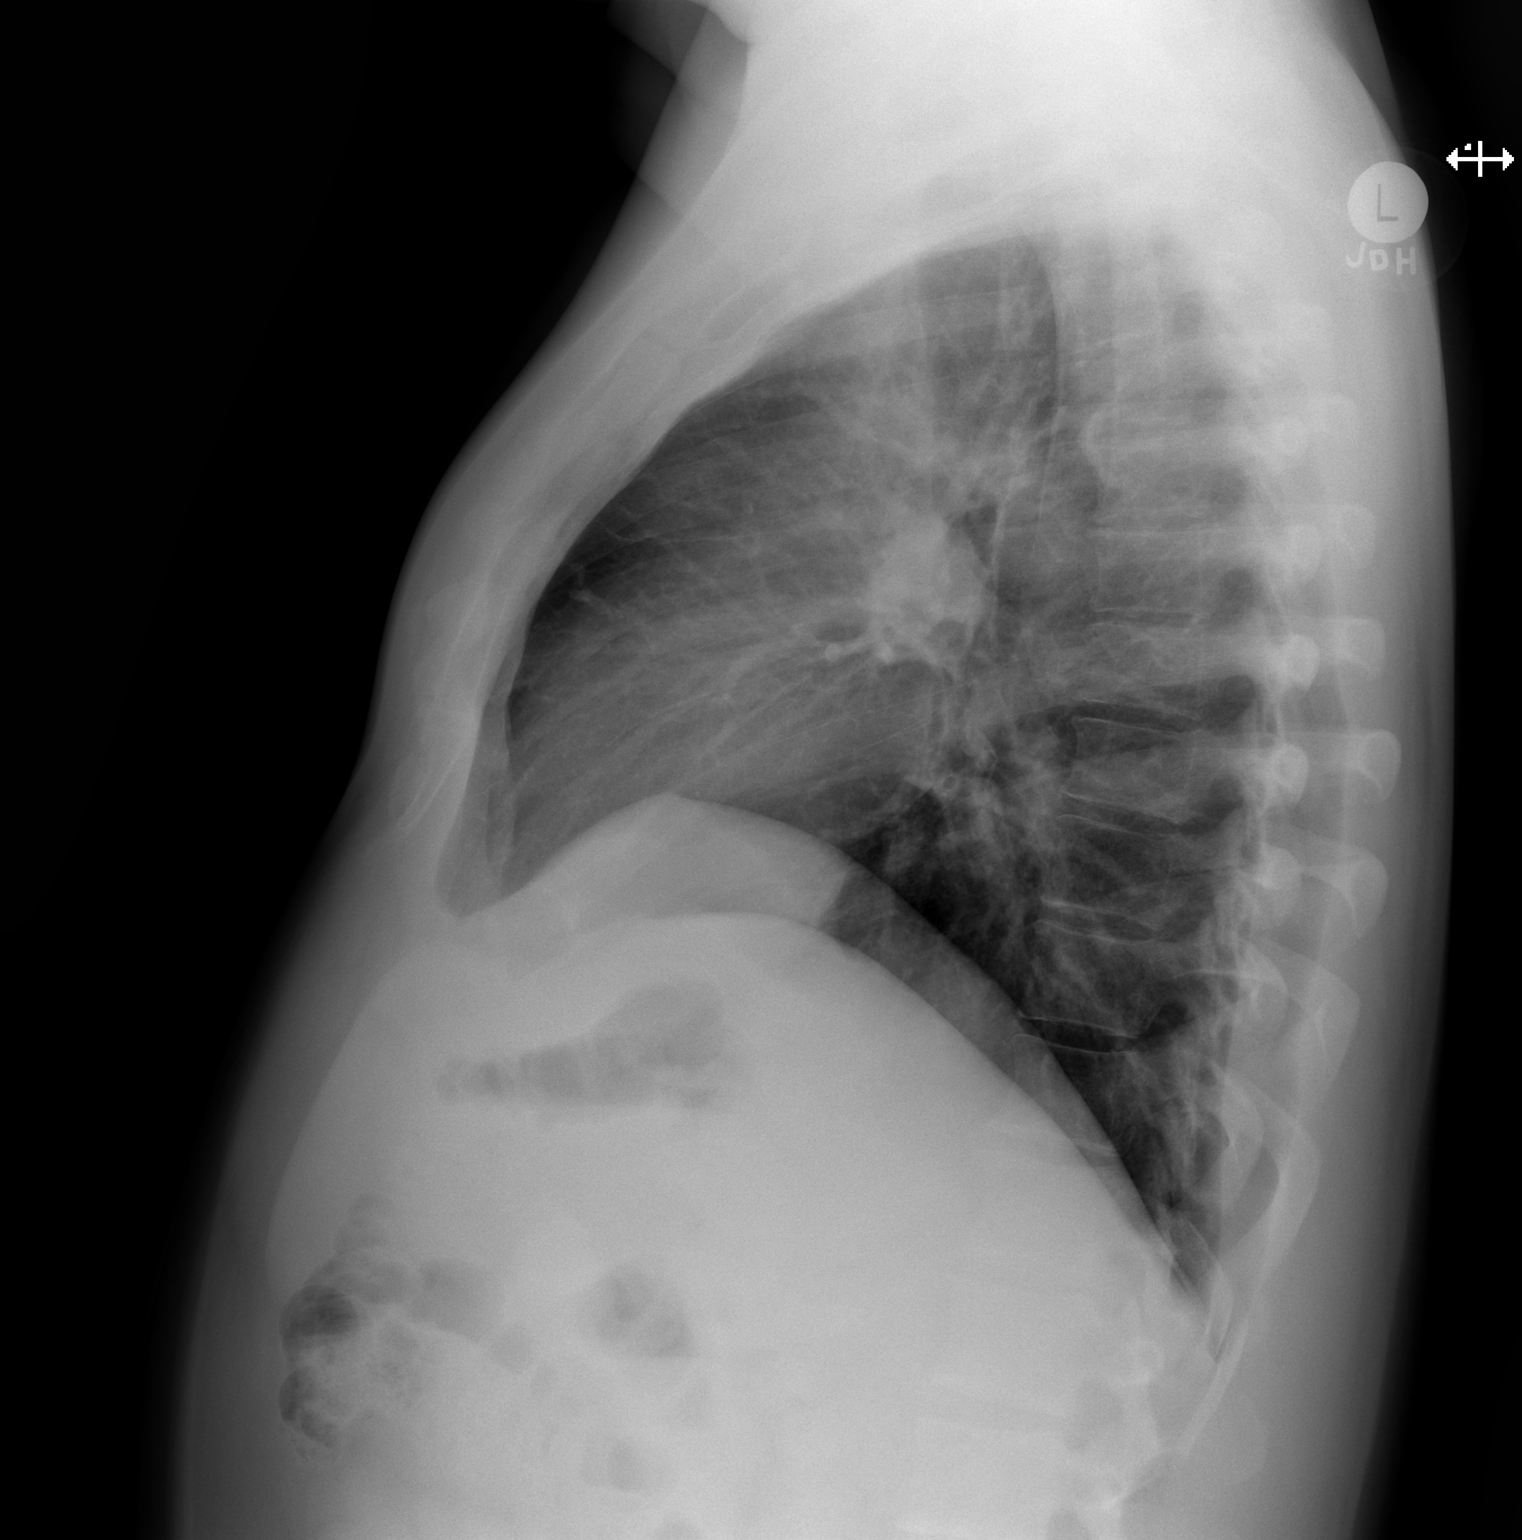

[2 of 2 positions shown; findings below may reference images not displayed]

FINDINGS: Mediastinum hilar structures normal. Lungs are clear. Heart size
normal. No pleural effusion or pneumothorax. Degenerative changes
thoracic spine.
IMPRESSION: No acute cardiopulmonary disease.

## 2019-08-09 ENCOUNTER — Ambulatory Visit: Payer: Self-pay | Attending: Internal Medicine

## 2019-08-09 DIAGNOSIS — U071 COVID-19: Secondary | ICD-10-CM | POA: Insufficient documentation

## 2019-08-09 DIAGNOSIS — Z20822 Contact with and (suspected) exposure to covid-19: Secondary | ICD-10-CM

## 2019-08-10 LAB — NOVEL CORONAVIRUS, NAA: SARS-CoV-2, NAA: DETECTED — AB

## 2019-08-11 ENCOUNTER — Telehealth: Payer: Self-pay | Admitting: Nurse Practitioner

## 2019-08-11 ENCOUNTER — Telehealth: Payer: Self-pay | Admitting: Adult Health

## 2019-08-11 NOTE — Telephone Encounter (Signed)
Called todiscuss with Elisha Ponder about Covid symptoms and the use of bamlanivimab, a monoclonal antibody infusion for those with mild to moderate Covid symptoms and at a high risk of hospitalization.   Pt is qualified for this infusion at the [facility] infusion center due to co-morbid conditions (hypertension and diabetes) and/or a member of an at-risk group.  Unable to reach patient with use of interpretation system. Unable to leave voicemail (per interpretor/Daniel).  MyChart message previously sent.  William Hamburger, NP-C

## 2019-08-11 NOTE — Telephone Encounter (Signed)
Called to discuss with Arthur Cox about Covid symptoms and the use of bamlanivimab, a monoclonal antibody infusion for those with mild to moderate Covid symptoms and at a high risk of hospitalization.     Pt is qualified for this infusion at the [facility] infusion center due to co-morbid conditions (hypertension and diabetes) and/or a member of an at-risk group.  Unable to reach patient with use of interpretation system. Voicemail left and MyChart (spanish version) sent also.      Patient Active Problem List   Diagnosis Date Noted  . Cellulitis 12/17/2012  . Type II or unspecified type diabetes mellitus without mention of complication, uncontrolled 12/17/2012  . HTN (hypertension) 12/17/2012    [name], AGPCNP-BC Pager: [phone] Amion: N. Cousar

## 2019-08-12 NOTE — Progress Notes (Signed)
Your test for COVID-19 was positive ("detected"), meaning that you were infected with the novel coronavirus and could give the germ to others.    Please continue isolation at home, for at least 10 days since the start of your fever/cough/breathlessness and until you have had 24 hours without fever (without taking a fever reducer) and with any cough/breathlessness improving. Use over-the-counter medications for symptoms.  If you have had no symptoms, but were exposed to someone who was positive for COVID-19, you will need to quarantine and self-isolate for 14 days from the date of exposure.    Please continue good preventive care measures, including:  frequent hand-washing, avoid touching your face, cover coughs/sneezes, stay out of crowds and keep a 6 foot distance from others.  Clean hard surfaces touched frequently with disinfectant cleaning products.   Please check in with your primary care provider about your positive test result.  Go to the nearest urgent care or ED for assessment if you have severe breathlessness or severe weakness/fatigue (ex needing new help getting out of bed or to the bathroom).  Members of your household will also need to quarantine for 14 days from the date of your positive test. You may be contacted to discuss possible treatment options, and you may also be contacted by the health department for follow up. Please call [place] at [phone] if you have any questions or concerns.     

## 2019-08-20 ENCOUNTER — Ambulatory Visit: Payer: Self-pay | Attending: Internal Medicine

## 2019-08-20 DIAGNOSIS — Z20822 Contact with and (suspected) exposure to covid-19: Secondary | ICD-10-CM

## 2019-08-21 LAB — NOVEL CORONAVIRUS, NAA: SARS-CoV-2, NAA: NOT DETECTED

## 2019-08-30 ENCOUNTER — Encounter: Payer: Self-pay | Admitting: Registered Nurse

## 2019-08-30 ENCOUNTER — Ambulatory Visit (INDEPENDENT_AMBULATORY_CARE_PROVIDER_SITE_OTHER): Payer: Self-pay | Admitting: Registered Nurse

## 2019-08-30 ENCOUNTER — Other Ambulatory Visit: Payer: Self-pay

## 2019-08-30 VITALS — BP 123/81 | HR 80 | Temp 97.9°F | Ht 64.5 in | Wt 166.0 lb

## 2019-08-30 DIAGNOSIS — E785 Hyperlipidemia, unspecified: Secondary | ICD-10-CM

## 2019-08-30 DIAGNOSIS — M79661 Pain in right lower leg: Secondary | ICD-10-CM

## 2019-08-30 DIAGNOSIS — E119 Type 2 diabetes mellitus without complications: Secondary | ICD-10-CM

## 2019-08-30 DIAGNOSIS — J1282 Pneumonia due to coronavirus disease 2019: Secondary | ICD-10-CM

## 2019-08-30 DIAGNOSIS — E1169 Type 2 diabetes mellitus with other specified complication: Secondary | ICD-10-CM

## 2019-08-30 DIAGNOSIS — U071 COVID-19: Secondary | ICD-10-CM

## 2019-08-30 LAB — POCT GLYCOSYLATED HEMOGLOBIN (HGB A1C): Hemoglobin A1C: 9.8 % — AB (ref 4.0–5.6)

## 2019-08-30 MED ORDER — AZITHROMYCIN 250 MG PO TABS: ORAL_TABLET | ORAL | 0 refills | Status: DC

## 2019-08-30 MED ORDER — METFORMIN HCL 1000 MG PO TABS: 1000.0000 mg | ORAL_TABLET | Freq: Two times a day (BID) | ORAL | 3 refills | Status: DC

## 2019-08-30 MED ORDER — ROSUVASTATIN CALCIUM 20 MG PO TABS: 20.0000 mg | ORAL_TABLET | Freq: Every day | ORAL | 3 refills | Status: DC

## 2019-08-30 NOTE — Progress Notes (Signed)
New Patient Office Visit  Subjective:  Patient ID: [facility], male    DOB: 01-30-1981  Age: 39 y.o. MRN: [id number]  CC:  Chief Complaint  Patient presents with  . New Patient (Initial Visit)    establish care. patient states that he had covid 07/14/2019 but now has an negative test and would like to get his lungs checked . also discuss getting back on diabetes treatment.    HPI Arthur Cox presents for visit to establish care  He is following up from a recent COVID infection - positive test on March 8, negative test on March 19. Dramatic improvement in symptoms including respiratory symptoms and has not had a fever. Still has some cramping pain in R calf and some shob / cough, though these seem to be improving somewhat  History of t2dm for which he was last treated in 2017. Has been out of medication since then. Was taking metformin and tolerating it well.   Past Medical History:  Diagnosis Date  . Asthma   . Cellulitis 12/17/2012   RIGHT FOOT  . Diabetes mellitus     Past Surgical History:  Procedure Laterality Date  . NO PAST SURGERIES      No family history on file.  Social History   Socioeconomic History  . Marital status: Married    Spouse name: Not on file  . Number of children: Not on file  . Years of education: Not on file  . Highest education level: Not on file  Occupational History  . Not on file  Tobacco Use  . Smoking status: Never Smoker  . Smokeless tobacco: Never Used  Substance and Sexual Activity  . Alcohol use: Yes    Comment: ONCE  A WEEK  . Drug use: No  . Sexual activity: Not on file  Other Topics Concern  . Not on file  Social History Narrative  . Not on file   Social Determinants of Health   Financial Resource Strain:   . Difficulty of Paying Living Expenses:   Food Insecurity:   . Worried About Charity fundraiser in the Last Year:   . Arboriculturist in the Last Year:   Transportation Needs:   . Lexicographer (Medical):   Marland Kitchen Lack of Transportation (Non-Medical):   Physical Activity:   . Days of Exercise per Week:   . Minutes of Exercise per Session:   Stress:   . Feeling of Stress :   Social Connections:   . Frequency of Communication with Friends and Family:   . Frequency of Social Gatherings with Friends and Family:   . Attends Religious Services:   . Active Member of Clubs or Organizations:   . Attends Archivist Meetings:   Marland Kitchen Marital Status:   Intimate Partner Violence:   . Fear of Current or Ex-Partner:   . Emotionally Abused:   Marland Kitchen Physically Abused:   . Sexually Abused:     ROS Review of Systems  Constitutional: Negative.   HENT: Negative.   Eyes: Negative.   Respiratory: Positive for cough and shortness of breath. Negative for apnea, choking, chest tightness, wheezing and stridor.   Cardiovascular: Negative.   Gastrointestinal: Negative.   Endocrine: Negative.   Genitourinary: Negative.   Musculoskeletal: Positive for myalgias. Negative for arthralgias, back pain, gait problem, joint swelling, neck pain and neck stiffness.  Skin: Negative.   Allergic/Immunologic: Negative.   Neurological: Negative.   Hematological: Negative.  Psychiatric/Behavioral: Negative.   All other systems reviewed and are negative.   Objective:   Today's Vitals: BP 123/81   Pulse 80   Temp 97.9 F (36.6 C) (Temporal)   Ht 5' 4.5" (1.638 m)   Wt 166 lb (75.3 kg)   SpO2 98%   BMI 28.05 kg/m   Physical Exam Vitals and nursing note reviewed.  Constitutional:      General: He is not in acute distress.    Appearance: Normal appearance. He is normal weight. He is not ill-appearing, toxic-appearing or diaphoretic.  Cardiovascular:     Rate and Rhythm: Normal rate and regular rhythm.     Pulses: Normal pulses.     Heart sounds: Normal heart sounds. No murmur. No friction rub. No gallop.   Pulmonary:     Effort: Pulmonary effort is normal. No respiratory distress.       Breath sounds: No stridor. No wheezing, rhonchi or rales.     Comments: Diminished lung sounds at both bases. Chest:     Chest wall: No tenderness.  Skin:    General: Skin is warm and dry.     Capillary Refill: Capillary refill takes less than 2 seconds.     Coloration: Skin is not jaundiced or pale.     Findings: No bruising, erythema, lesion or rash.  Neurological:     General: No focal deficit present.     Mental Status: He is alert and oriented to person, place, and time. Mental status is at baseline.     Cranial Nerves: No cranial nerve deficit.     Motor: No weakness.     Gait: Gait normal.  Psychiatric:        Mood and Affect: Mood normal.        Behavior: Behavior normal.        Thought Content: Thought content normal.        Judgment: Judgment normal.     Assessment & Plan:   Problem List Items Addressed This Visit    None    Visit Diagnoses    Type 2 diabetes mellitus without complication, without long-term current use of insulin (HCC)    -  Primary   Relevant Medications   metFORMIN (GLUCOPHAGE) 1000 MG tablet   rosuvastatin (CRESTOR) 20 MG tablet   Other Relevant Orders   POCT glycosylated hemoglobin (Hb A1C) (Completed)   CBC   Lipid Panel   Basic Metabolic Panel   Pain in right lower leg       Relevant Orders   CBC   Basic Metabolic Panel   Hyperlipidemia associated with type 2 diabetes mellitus (HCC)       Relevant Medications   metFORMIN (GLUCOPHAGE) 1000 MG tablet   rosuvastatin (CRESTOR) 20 MG tablet      Outpatient Encounter Medications as of 08/30/2019  Medication Sig  . metFORMIN (GLUCOPHAGE) 1000 MG tablet Take 1 tablet (1,000 mg total) by mouth 2 (two) times daily with a meal.  . rosuvastatin (CRESTOR) 20 MG tablet Take 1 tablet (20 mg total) by mouth daily.   No facility-administered encounter medications on file as of 08/30/2019.    Follow-up: No follow-ups on file.   PLAN  A1c onsite at 9.8 today. Will resume metformin 1000mg   Po bid  Start rosuvastatin 20mg  PO q1600   Upon exam, calf pain not likely PAD as hair growth and distribution is normal, pulses strong. Likely muscle strain and dehydration, suggest nonpharm for this  Diminished lung sounds - concern  for COVID pna, z pack given  Patient encouraged to call clinic with any questions, comments, or concerns.  Janeece Agee, NP

## 2019-08-30 NOTE — Patient Instructions (Signed)
° ° ° °  If you have lab work done today you will be contacted with your lab results within the next 2 weeks.  If you have not heard from us then please contact us. The fastest way to get your results is to register for My Chart. ° ° °IF you received an x-ray today, you will receive an invoice from [facility]. Please contact [facility] at [phone] with questions or concerns regarding your invoice.  ° °IF you received labwork today, you will receive an invoice from [facility]. Please contact [facility] at [phone] with questions or concerns regarding your invoice.  ° °Our billing staff will not be able to assist you with questions regarding bills from these companies. ° °You will be contacted with the lab results as soon as they are available. The fastest way to get your results is to activate your My Chart account. Instructions are located on the last page of this paperwork. If you have not heard from us regarding the results in 2 weeks, please contact this office. °  ° ° ° °

## 2019-08-31 LAB — BASIC METABOLIC PANEL
BUN/Creatinine Ratio: 16 (ref 9–20)
BUN: 9 mg/dL (ref 6–20)
CO2: 19 mmol/L — ABNORMAL LOW (ref 20–29)
Calcium: 9.6 mg/dL (ref 8.7–10.2)
Chloride: 102 mmol/L (ref 96–106)
Creatinine, Ser: 0.58 mg/dL — ABNORMAL LOW (ref 0.76–1.27)
GFR calc Af Amer: 150 mL/min/{1.73_m2} (ref >59)
GFR calc non Af Amer: 129 mL/min/{1.73_m2} (ref >59)
Glucose: 194 mg/dL — ABNORMAL HIGH (ref 65–99)
Potassium: 4.8 mmol/L (ref 3.5–5.2)
Sodium: 139 mmol/L (ref 134–144)

## 2019-08-31 LAB — LIPID PANEL
Chol/HDL Ratio: 5.8 ratio — ABNORMAL HIGH (ref 0.0–5.0)
Cholesterol, Total: 213 mg/dL — ABNORMAL HIGH (ref 100–199)
HDL: 37 mg/dL — ABNORMAL LOW (ref >39)
LDL Chol Calc (NIH): 149 mg/dL — ABNORMAL HIGH (ref 0–99)
Triglycerides: 150 mg/dL — ABNORMAL HIGH (ref 0–149)
VLDL Cholesterol Cal: 27 mg/dL (ref 5–40)

## 2019-08-31 LAB — CBC
Hematocrit: 45.7 % (ref 37.5–51.0)
Hemoglobin: 15.5 g/dL (ref 13.0–17.7)
MCH: 29.6 pg (ref 26.6–33.0)
MCHC: 33.9 g/dL (ref 31.5–35.7)
MCV: 87 fL (ref 79–97)
Platelets: 271 10*3/uL (ref 150–450)
RBC: 5.24 x10E6/uL (ref 4.14–5.80)
RDW: 13.1 % (ref 11.6–15.4)
WBC: 6.2 10*3/uL (ref 3.4–10.8)

## 2019-09-13 ENCOUNTER — Encounter: Payer: Self-pay | Admitting: Registered Nurse

## 2019-09-13 NOTE — Progress Notes (Signed)
Pt called  No answer vm not set up Sending letter  Lipids elevated Needs to start statin Has TOC with [facility] on 10/05/19 - will advise [facility] of this  Jari Sportsman, NP

## 2019-10-05 ENCOUNTER — Telehealth: Payer: Self-pay | Admitting: *Deleted

## 2019-10-05 ENCOUNTER — Other Ambulatory Visit: Payer: Self-pay

## 2019-10-05 ENCOUNTER — Encounter: Payer: Self-pay | Admitting: Emergency Medicine

## 2019-10-05 ENCOUNTER — Ambulatory Visit (INDEPENDENT_AMBULATORY_CARE_PROVIDER_SITE_OTHER): Payer: Self-pay | Admitting: Emergency Medicine

## 2019-10-05 VITALS — BP 112/75 | HR 86 | Temp 98.4°F | Resp 16 | Ht 64.5 in | Wt 170.0 lb

## 2019-10-05 DIAGNOSIS — Z8616 Personal history of COVID-19: Secondary | ICD-10-CM

## 2019-10-05 DIAGNOSIS — Z7689 Persons encountering health services in other specified circumstances: Secondary | ICD-10-CM

## 2019-10-05 DIAGNOSIS — E785 Hyperlipidemia, unspecified: Secondary | ICD-10-CM

## 2019-10-05 DIAGNOSIS — E1165 Type 2 diabetes mellitus with hyperglycemia: Secondary | ICD-10-CM

## 2019-10-05 DIAGNOSIS — N529 Male erectile dysfunction, unspecified: Secondary | ICD-10-CM

## 2019-10-05 DIAGNOSIS — E1169 Type 2 diabetes mellitus with other specified complication: Secondary | ICD-10-CM

## 2019-10-05 MED ORDER — GLIPIZIDE 5 MG PO TABS: 5.0000 mg | ORAL_TABLET | Freq: Two times a day (BID) | ORAL | 3 refills | Status: DC

## 2019-10-05 MED ORDER — SILDENAFIL CITRATE 100 MG PO TABS: 50.0000 mg | ORAL_TABLET | Freq: Every day | ORAL | 11 refills | Status: DC | PRN

## 2019-10-05 MED ORDER — SILDENAFIL CITRATE 100 MG PO TABS
50.0000 mg | ORAL_TABLET | Freq: Every day | ORAL | 11 refills | Status: DC | PRN
Start: 2019-10-05 — End: 2020-01-04

## 2019-10-05 MED ORDER — GLIPIZIDE 5 MG PO TABS
5.0000 mg | ORAL_TABLET | Freq: Two times a day (BID) | ORAL | 3 refills | Status: DC
Start: 2019-10-05 — End: 2020-01-04

## 2019-10-05 NOTE — Patient Instructions (Signed)
Diabetes mellitus y nutricin, en adultos Diabetes Mellitus and Nutrition, Adult Si sufre de diabetes (diabetes mellitus), es muy importante tener hbitos alimenticios saludables debido a que sus niveles de azcar en la sangre (glucosa) se ven afectados en gran medida por lo que come y bebe. [place] alimentos saludables en las cantidades adecuadas, aproximadamente a la misma hora todos los das, lo ayudar a:  Controlar la glucemia.  Disminuir el riesgo de sufrir una enfermedad cardaca.  Mejorar la presin arterial.  Alcanzar o mantener un peso saludable. Todas las personas que sufren de diabetes son diferentes y cada una tiene necesidades diferentes en cuanto a un plan de alimentacin. El mdico puede recomendarle que trabaje con un especialista en dietas y nutricin (nutricionista) para elaborar el mejor plan para usted. Su plan de alimentacin puede variar segn factores como:  Las caloras que necesita.  Los medicamentos que toma.  Su peso.  Sus niveles de glucemia, presin arterial y colesterol.  Su nivel de actividad.  Otras afecciones que tenga, como enfermedades cardacas o renales. Cmo me afectan los carbohidratos? Los carbohidratos, o hidratos de carbono, afectan su nivel de glucemia ms que cualquier otro tipo de alimento. La ingesta de carbohidratos naturalmente aumenta la cantidad de glucosa en la sangre. El recuento de carbohidratos es un mtodo destinado a llevar un registro de la cantidad de carbohidratos que se consumen. El recuento de carbohidratos es importante para mantener la glucemia a un nivel saludable, especialmente si utiliza insulina o toma determinados medicamentos por va oral para la diabetes. Es importante conocer la cantidad de carbohidratos que se pueden ingerir en cada comida sin correr ningn riesgo. Esto es diferente en cada persona. Su nutricionista puede ayudarlo a calcular la cantidad de carbohidratos que debe ingerir en cada comida y en cada  refrigerio. Entre los alimentos que contienen carbohidratos, se incluyen:  Pan, cereal, arroz, pastas y galletas.  Papas y maz.  Guisantes, frijoles y lentejas.  Leche y yogur.  Frutas y jugo.  Postres, como pasteles, galletas, helado y caramelos. Cmo me afecta el alcohol? El alcohol puede provocar disminuciones sbitas de la glucemia (hipoglucemia), especialmente si utiliza insulina o toma determinados medicamentos por va oral para la diabetes. La hipoglucemia es una afeccin potencialmente mortal. Los sntomas de la hipoglucemia (somnolencia, mareos y confusin) son similares a los sntomas de haber consumido demasiado alcohol. Si el mdico afirma que el alcohol es seguro para usted, siga estas pautas:  Limite el consumo de alcohol a no ms de 1medida por da si es mujer y no est embarazada, y a 2medidas si es hombre. Una medida equivale a 12oz (355ml) de cerveza, 5oz (148ml) de vino o 1oz (44ml) de bebidas alcohlicas de alta graduacin.  No beba con el estmago vaco.  Mantngase hidratado bebiendo agua, refrescos dietticos o t helado sin azcar.  Tenga en cuenta que los refrescos comunes, los jugos y otras bebida para mezclar pueden contener mucha azcar y se deben contar como carbohidratos. Cules son algunos consejos para seguir este plan?  Leer las etiquetas de los alimentos  Comience por leer el tamao de la porcin en la "Informacin nutricional" en las etiquetas de los alimentos envasados y las bebidas. La cantidad de caloras, carbohidratos, grasas y otros nutrientes mencionados en la etiqueta se basan en una porcin del alimento. Muchos alimentos contienen ms de una porcin por envase.  Verifique la cantidad total de gramos (g) de carbohidratos totales en una porcin. Puede calcular la cantidad de porciones de carbohidratos al dividir el   total de carbohidratos por 15. Por ejemplo, si un alimento tiene un total de 30g de carbohidratos, equivale a 2  porciones de carbohidratos.  Verifique la cantidad de gramos (g) de grasas saturadas y grasas trans en una porcin. Escoja alimentos que no contengan grasa o que tengan un bajo contenido.  Verifique la cantidad de miligramos (mg) de sal (sodio) en una porcin. La mayora de las personas deben limitar la ingesta de sodio total a menos de 2300mg por da.  Siempre consulte la informacin nutricional de los alimentos etiquetados como "con bajo contenido de grasa" o "sin grasa". Estos alimentos pueden tener un mayor contenido de azcar agregada o carbohidratos refinados, y deben evitarse.  Hable con su nutricionista para identificar sus objetivos diarios en cuanto a los nutrientes mencionados en la etiqueta. Al ir de compras  Evite comprar alimentos procesados, enlatados o precocinados. Estos alimentos tienden a tener una mayor cantidad de grasa, sodio y azcar agregada.  Compre en la zona exterior de la tienda de comestibles. Esta zona incluye frutas y verduras frescas, granos a granel, carnes frescas y productos lcteos frescos. Al cocinar  Utilice mtodos de coccin a baja temperatura, como hornear, en lugar de mtodos de coccin a alta temperatura, como frer en abundante aceite.  Cocine con aceites saludables, como el aceite de oliva, canola o girasol.  Evite cocinar con manteca, crema o carnes con alto contenido de grasa. Planificacin de las comidas  Coma las comidas y los refrigerios regularmente, preferentemente a la misma hora todos los das. Evite pasar largos perodos de tiempo sin comer.  Consuma alimentos ricos en fibra, como frutas frescas, verduras, frijoles y cereales integrales. Consulte a su nutricionista sobre cuntas porciones de carbohidratos puede consumir en cada comida.  Consuma entre 4 y 6 onzas (oz) de protenas magras por da, como carnes magras, pollo, pescado, huevos o tofu. Una onza de protena magra equivale a: ? 1 onza de carne, pollo o  pescado. ? 1huevo. ?  taza de tofu.  Coma algunos alimentos por da que contengan grasas saludables, como aguacates, frutos secos, semillas y pescado. Estilo de vida  Controle su nivel de glucemia con regularidad.  Haga actividad fsica habitualmente como se lo haya indicado el mdico. Esto puede incluir lo siguiente: ? 150minutos semanales de ejercicio de intensidad moderada o alta. Esto podra incluir caminatas dinmicas, ciclismo o gimnasia acutica. ? Realizar ejercicios de elongacin y de fortalecimiento, como yoga o levantamiento de pesas, por lo menos 2veces por semana.  Tome los medicamentos como se lo haya indicado el mdico.  No consuma ningn producto que contenga nicotina o tabaco, como cigarrillos y cigarrillos electrnicos. Si necesita ayuda para dejar de fumar, consulte al mdico.  Trabaje con un asesor o instructor en diabetes para identificar estrategias para controlar el estrs y cualquier desafo emocional y social. Preguntas para hacerle al mdico  Es necesario que consulte a un instructor en el cuidado de la diabetes?  Es necesario que me rena con un nutricionista?  A qu nmero puedo llamar si tengo preguntas?  Cules son los mejores momentos para controlar la glucemia? Dnde encontrar ms informacin:  Asociacin Estadounidense de la Diabetes (American Diabetes Association): diabetes.org  Academia de Nutricin y Diettica (Academy of Nutrition and Dietetics): www.eatright.org  Instituto Nacional de la Diabetes y las Enfermedades Digestivas y Renales (National Institute of Diabetes and Digestive and Kidney Diseases, NIH): www.niddk.nih.gov Resumen  Un plan de alimentacin saludable lo ayudar a controlar la glucemia y mantener un estilo de vida saludable.    Trabajar con un especialista en dietas y nutricin (nutricionista) puede ayudarlo a elaborar el mejor plan de alimentacin para usted.  Tenga en cuenta que los carbohidratos (hidratos de  carbono) y el alcohol tienen efectos inmediatos en sus niveles de glucemia. Es importante contar los carbohidratos que ingiere y consumir alcohol con prudencia. Esta informacin no tiene como fin reemplazar el consejo del mdico. Asegrese de hacerle al mdico cualquier pregunta que tenga. Document Revised: 01/28/2017 Document Reviewed: 09/09/2016 Elsevier Patient Education  2020 Elsevier Inc.  

## 2019-10-05 NOTE — Progress Notes (Signed)
Arthur Cox 39 y.o.   Chief Complaint  Patient presents with  . Establish Care    transfer from [place]   . Diabetes    HISTORY OF PRESENT ILLNESS: This is a 39 y.o. male with history of diabetes here to establish care with me. Has had diabetes for least 8 years.  Used to be on insulin. Presently only taking Metformin 1000 mg twice a day. Lab Results  Component Value Date   HGBA1C 9.8 (A) 08/30/2019  Abnormal lipid profile on 08/30/2019 Lab Results  Component Value Date   CHOL 213 (H) 08/30/2019   HDL 37 (L) 08/30/2019   LDLCALC 149 (H) 08/30/2019   TRIG 150 (H) 08/30/2019   CHOLHDL 5.8 (H) 08/30/2019  Started on Crestor 20 mg daily.     HPI   Prior to Admission medications   Medication Sig Start Date End Date Taking? Authorizing Provider  metFORMIN (GLUCOPHAGE) 1000 MG tablet Take 1 tablet (1,000 mg total) by mouth 2 (two) times daily with a meal. 08/30/19  Yes Maximiano Coss, NP  rosuvastatin (CRESTOR) 20 MG tablet Take 1 tablet (20 mg total) by mouth daily. 08/30/19  Yes Maximiano Coss, NP    No Known Allergies  Patient Active Problem List   Diagnosis Date Noted  . Type II or unspecified type diabetes mellitus without mention of complication, uncontrolled 12/17/2012  . HTN (hypertension) 12/17/2012    Past Medical History:  Diagnosis Date  . Asthma   . Cellulitis 12/17/2012   RIGHT FOOT  . Diabetes mellitus     Past Surgical History:  Procedure Laterality Date  . NO PAST SURGERIES      Social History   Socioeconomic History  . Marital status: Married    Spouse name: Not on file  . Number of children: Not on file  . Years of education: Not on file  . Highest education level: Not on file  Occupational History  . Not on file  Tobacco Use  . Smoking status: Never Smoker  . Smokeless tobacco: Never Used  Substance and Sexual Activity  . Alcohol use: Yes    Comment: ONCE  A WEEK  . Drug use: No  . Sexual activity: Not on file  Other  Topics Concern  . Not on file  Social History Narrative  . Not on file   Social Determinants of Health   Financial Resource Strain:   . Difficulty of Paying Living Expenses:   Food Insecurity:   . Worried About Charity fundraiser in the Last Year:   . Arboriculturist in the Last Year:   Transportation Needs:   . Film/video editor (Medical):   Marland Kitchen Lack of Transportation (Non-Medical):   Physical Activity:   . Days of Exercise per Week:   . Minutes of Exercise per Session:   Stress:   . Feeling of Stress :   Social Connections:   . Frequency of Communication with Friends and Family:   . Frequency of Social Gatherings with Friends and Family:   . Attends Religious Services:   . Active Member of Clubs or Organizations:   . Attends Archivist Meetings:   Marland Kitchen Marital Status:   Intimate Partner Violence:   . Fear of Current or Ex-Partner:   . Emotionally Abused:   Marland Kitchen Physically Abused:   . Sexually Abused:     History reviewed. No pertinent family history.   Review of Systems  Constitutional: Negative.  Negative for chills and fever.  HENT: Negative.  Negative for congestion and sore throat.   Respiratory: Negative.  Negative for cough and shortness of breath.   Cardiovascular: Negative.  Negative for chest pain and palpitations.  Gastrointestinal: Negative.  Negative for abdominal pain, blood in stool, diarrhea, nausea and vomiting.  Genitourinary:       Erectile dysfunction  Musculoskeletal: Negative for back pain, myalgias and neck pain.  Skin: Negative.  Negative for rash.  Neurological: Negative for dizziness and headaches.  All other systems reviewed and are negative.  Today's Vitals   10/05/19 1328  BP: 112/75  Pulse: 86  Resp: 16  Temp: 98.4 F (36.9 C)  TempSrc: Temporal  SpO2: 97%  Weight: 170 lb (77.1 kg)  Height: 5' 4.5" (1.638 m)   Body mass index is 28.73 kg/m.   Physical Exam Vitals reviewed.  Constitutional:      Appearance:  Normal appearance.  HENT:     Head: Normocephalic.  Eyes:     Extraocular Movements: Extraocular movements intact.     Conjunctiva/sclera: Conjunctivae normal.     Pupils: Pupils are equal, round, and reactive to light.  Cardiovascular:     Rate and Rhythm: Normal rate and regular rhythm.     Pulses: Normal pulses.     Heart sounds: Normal heart sounds.  Abdominal:     Palpations: Abdomen is soft.     Tenderness: There is no abdominal tenderness.  Musculoskeletal:        General: Normal range of motion.     Cervical back: Normal range of motion and neck supple.  Skin:    General: Skin is warm and dry.  Neurological:     General: No focal deficit present.     Mental Status: He is alert and oriented to person, place, and time.  Psychiatric:        Mood and Affect: Mood normal.        Behavior: Behavior normal.      ASSESSMENT & PLAN: Izzak was seen today for establish care and diabetes.  Diagnoses and all orders for this visit:  Hyperlipidemia associated with type 2 diabetes mellitus (HCC) -     Comprehensive metabolic panel -     Lipid panel -     Microalbumin, urine -     Ambulatory referral to Ophthalmology -     glipiZIDE (GLUCOTROL) 5 MG tablet; Take 1 tablet (5 mg total) by mouth 2 (two) times daily with a meal.  History of COVID-19 -     SAR CoV2 Serology (COVID 19)AB(IGG)IA  Encounter to establish care  Erectile dysfunction, unspecified erectile dysfunction type -     sildenafil (VIAGRA) 100 MG tablet; Take 0.5-1 tablets (50-100 mg total) by mouth daily as needed for erectile dysfunction.  Type 2 diabetes mellitus with hyperglycemia, without long-term current use of insulin (HCC)    Patient Instructions  Diabetes mellitus y nutricin, en adultos Diabetes Mellitus and Nutrition, Adult Si sufre de diabetes (diabetes mellitus), es muy importante tener hbitos alimenticios saludables debido a que sus niveles de Psychologist, counselling sangre (glucosa) se ven afectados en  gran medida por lo que come y bebe. [place] alimentos saludables en las cantidades [place], aproximadamente a la [employer], [place] ayudar a:  Scientist, physiological glucemia.  Disminuir el riesgo de sufrir una enfermedad cardaca.  Mejorar la presin arterial.  Barista o mantener un peso saludable. Todas las personas que sufren de diabetes son diferentes y cada una tiene necesidades diferentes en cuanto  a un plan de alimentacin. El mdico puede recomendarle que trabaje con un especialista en dietas y nutricin (nutricionista) para Tax adviser plan para usted. Su plan de alimentacin puede variar segn factores como:  Las caloras que necesita.  Los medicamentos que toma.  Su peso.  Sus niveles de glucemia, presin arterial y colesterol.  Su nivel de [place].  Otras afecciones que tenga, como enfermedades cardacas o renales. Cmo me afectan los carbohidratos? Los carbohidratos, o hidratos de carbono, afectan su nivel de glucemia ms que cualquier otro tipo de alimento. La ingesta de carbohidratos naturalmente aumenta la cantidad de [employer]. El recuento de carbohidratos es un mtodo destinado a Midwife un registro de la cantidad de carbohidratos que se consumen. El recuento de carbohidratos es importante para Pharmacologist la glucemia a un nivel saludable, especialmente si utiliza insulina o toma determinados medicamentos por va oral para la diabetes. Es importante conocer la cantidad de carbohidratos que se pueden ingerir en cada comida sin correr Surveyor, minerals. Esto es Government social research officer. Su nutricionista puede ayudarlo a calcular la cantidad de carbohidratos que debe ingerir en cada comida y en cada refrigerio. Entre los alimentos que contienen carbohidratos, se incluyen:  Pan, cereal, arroz, pastas y galletas.  Papas y maz.  Guisantes, frijoles y lentejas.  Leche y Dentist.  Nils Pyle y [place].  Postres, como pasteles, galletas, helado y  caramelos. Cmo me afecta el alcohol? El alcohol puede provocar disminuciones sbitas de la glucemia (hipoglucemia), especialmente si utiliza insulina o toma determinados medicamentos por va oral para la diabetes. La hipoglucemia es una afeccin potencialmente mortal. Los sntomas de la hipoglucemia (somnolencia, mareos y confusin) son similares a los sntomas de haber consumido demasiado alcohol. Si el mdico afirma que el alcohol es seguro para usted, [place] estas pautas:  Limite el consumo de alcohol a no ms de por da si es mujer y no est [place], y a si es hombre. Una medida equivale a 12oz ( ) de cerveza, 5oz ( ) de vino o 1oz (92ml) de bebidas alcohlicas de alta graduacin.  No beba con el estmago vaco.  Mantngase hidratado bebiendo agua, refrescos dietticos o t helado sin azcar.  Tenga en cuenta que los refrescos comunes, los jugos y otras bebida para Engineer, manufacturing pueden contener mucha azcar y se deben contar como carbohidratos. Cules son algunos consejos para seguir este plan?  Leer las etiquetas de los alimentos  Comience por leer el tamao de la porcin en la "Informacin nutricional" en las etiquetas de los alimentos envasados y las bebidas. La cantidad de caloras, carbohidratos, grasas y otros nutrientes mencionados en la etiqueta se basan en una porcin del alimento. Muchos alimentos contienen ms de una porcin por envase.  Verifique la cantidad total de gramos (g) de carbohidratos totales en una porcin. Puede calcular la cantidad de porciones de carbohidratos al dividir el total de carbohidratos por 15. Por ejemplo, si un alimento tiene un total de 30g de carbohidratos, equivale a 2 porciones de carbohidratos.  Verifique la cantidad de gramos (g) de grasas saturadas y grasas trans en una porcin. Escoja alimentos que no contengan grasa o que tengan un bajo contenido.  Verifique la cantidad de miligramos (mg) de sal (sodio) en una  porcin. La mayora de las personas deben limitar la ingesta de sodio total a menos de 2300mg  por .  Siempre consulte la informacin nutricional de los alimentos etiquetados como "con bajo contenido de grasa" o "sin grasa". Estos alimentos pueden tener un  mayor contenido de Engineer, mining o carbohidratos refinados, y deben evitarse.  Hable con su nutricionista para identificar sus objetivos diarios en cuanto a los nutrientes mencionados en la etiqueta. Al ir de compras  Evite comprar alimentos procesados, enlatados o precocinados. Estos alimentos tienden a Counselling psychologist mayor cantidad de [place], sodio y azcar agregada.  Compre en la zona exterior de la tienda de comestibles. Esta zona incluye frutas y verduras frescas, granos a granel, carnes frescas y productos lcteos frescos. Al cocinar  Utilice mtodos de coccin a baja temperatura, como hornear, en lugar de mtodos de coccin a alta temperatura, como frer en abundante aceite.  Cocine con aceites saludables, como el aceite de [place], canola o [place].  Evite cocinar con manteca, crema o carnes con alto contenido de grasa. Planificacin de las comidas  Coma las comidas y los refrigerios regularmente, preferentemente a la misma hora todos [place]. Evite pasar largos perodos de tiempo sin comer.  Consuma alimentos ricos en fibra, como frutas frescas, verduras, frijoles y cereales integrales. Consulte a su nutricionista sobre cuntas porciones de carbohidratos puede consumir en cada comida.  Consuma entre 4 y 6 onzas (oz) de protenas magras por da, como carnes [place], pollo, pescado, huevos o tofu. Una onza de protena magra equivale a: ? 1 onza de carne, pollo o pescado. ? 1huevo. ?  taza de tofu.  Coma algunos alimentos por da que contengan grasas saludables, como aguacates, frutos secos, semillas y pescado. Estilo de vida  Controle su nivel de glucemia con regularidad.  Haga actividad fsica habitualmente como se lo haya  indicado el mdico. Esto puede incluir lo siguiente: ? semanales de ejercicio de intensidad moderada o alta. Esto podra incluir caminatas dinmicas, ciclismo o gimnasia acutica. ? Realizar ejercicios de elongacin y de fortalecimiento, como yoga o levantamiento de pesas, por lo menos 2veces por semana.  Tome los [employer] se lo haya indicado el mdico.  No consuma ningn producto que contenga nicotina o tabaco, como cigarrillos y Administrator, Civil Service. Si necesita ayuda para dejar de fumar, consulte al [employer] con un asesor o instructor en diabetes para identificar estrategias para controlar el estrs y cualquier desafo emocional y social. Preguntas para hacerle al mdico  Es necesario que consulte a IT trainer en el cuidado de la diabetes?  Es necesario que me rena con un nutricionista?  A qu nmero puedo llamar si tengo preguntas?  Cules son los mejores momentos para controlar la glucemia? Dnde encontrar ms informacin:  Asociacin Estadounidense de la Diabetes (American Diabetes Association): diabetes.org  Academia de Nutricin y Pension scheme manager (Academy of Nutrition and Dietetics): www.eatright.org  [employer] de la Diabetes y las Enfermedades Digestivas y [place] [facility] of Diabetes and Digestive and Kidney Diseases, NIH): CarFlippers.tn Resumen  Un plan de alimentacin saludable lo ayudar a Scientist, physiological glucemia y Pharmacologist un estilo de vida saludable.  Trabajar con un especialista en dietas y nutricin (nutricionista) puede ayudarlo a Designer, television/film set de alimentacin para usted.  Tenga en cuenta que los carbohidratos (hidratos de carbono) y el alcohol tienen efectos inmediatos en sus niveles de glucemia. Es importante contar los carbohidratos que ingiere y consumir alcohol con prudencia. Esta informacin no tiene Theme park manager el consejo del mdico. Asegrese de hacerle al mdico cualquier pregunta que  tenga. Document Revised: 01/28/2017 Document Reviewed: 09/09/2016 Elsevier Patient Education  2020 Elsevier Inc.      Edwina Barth, MD [facility]

## 2019-10-05 NOTE — Addendum Note (Signed)
Addended by: Georg Ruddle A on: 10/05/2019 02:33 PM   Modules accepted: Orders

## 2019-10-05 NOTE — Telephone Encounter (Signed)
Called [facility] to cancel Glipizide 5 mg and sildenafil 100 mg, because patient changed to [employer]. Spoke to the pharmacist.

## 2019-10-06 ENCOUNTER — Encounter: Payer: Self-pay | Admitting: Emergency Medicine

## 2019-10-06 LAB — COMPREHENSIVE METABOLIC PANEL
ALT: 24 IU/L (ref 0–44)
AST: 18 IU/L (ref 0–40)
Albumin/Globulin Ratio: 1.8 (ref 1.2–2.2)
Albumin: 4.6 g/dL (ref 4.0–5.0)
Alkaline Phosphatase: 77 IU/L (ref 39–117)
BUN/Creatinine Ratio: 14 (ref 9–20)
BUN: 8 mg/dL (ref 6–20)
Bilirubin Total: 0.5 mg/dL (ref 0.0–1.2)
CO2: 23 mmol/L (ref 20–29)
Calcium: 9.5 mg/dL (ref 8.7–10.2)
Chloride: 98 mmol/L (ref 96–106)
Creatinine, Ser: 0.56 mg/dL — ABNORMAL LOW (ref 0.76–1.27)
GFR calc Af Amer: 151 mL/min/{1.73_m2} (ref >59)
GFR calc non Af Amer: 130 mL/min/{1.73_m2} (ref >59)
Globulin, Total: 2.5 g/dL (ref 1.5–4.5)
Glucose: 126 mg/dL — ABNORMAL HIGH (ref 65–99)
Potassium: 4.3 mmol/L (ref 3.5–5.2)
Sodium: 146 mmol/L — ABNORMAL HIGH (ref 134–144)
Total Protein: 7.1 g/dL (ref 6.0–8.5)

## 2019-10-06 LAB — LIPID PANEL
Chol/HDL Ratio: 2.6 ratio (ref 0.0–5.0)
Cholesterol, Total: 90 mg/dL — ABNORMAL LOW (ref 100–199)
HDL: 34 mg/dL — ABNORMAL LOW (ref >39)
LDL Chol Calc (NIH): 41 mg/dL (ref 0–99)
Triglycerides: 65 mg/dL (ref 0–149)
VLDL Cholesterol Cal: 15 mg/dL (ref 5–40)

## 2019-10-06 LAB — SAR COV2 SEROLOGY (COVID19)AB(IGG),IA: DiaSorin SARS-CoV-2 Ab, IgG: POSITIVE

## 2019-10-06 LAB — MICROALBUMIN, URINE: Microalbumin, Urine: 10.9 ug/mL

## 2019-10-06 NOTE — Telephone Encounter (Signed)
Pt called and stated he went by the [facility] at 7pm yesterday 10/05/19 for his medication. Pt states the pharmacy did not have the medication when he went. Please advise.

## 2019-10-11 ENCOUNTER — Telehealth: Payer: Self-pay | Admitting: Emergency Medicine

## 2019-10-11 ENCOUNTER — Other Ambulatory Visit: Payer: Self-pay

## 2019-10-11 DIAGNOSIS — E119 Type 2 diabetes mellitus without complications: Secondary | ICD-10-CM

## 2019-10-11 DIAGNOSIS — E785 Hyperlipidemia, unspecified: Secondary | ICD-10-CM

## 2019-10-11 MED ORDER — METFORMIN HCL 1000 MG PO TABS: 1000.0000 mg | ORAL_TABLET | Freq: Two times a day (BID) | ORAL | 3 refills | Status: DC

## 2019-10-11 MED ORDER — ROSUVASTATIN CALCIUM 20 MG PO TABS: 20.0000 mg | ORAL_TABLET | Freq: Every day | ORAL | 3 refills | Status: DC

## 2019-10-11 NOTE — Telephone Encounter (Signed)
Pt called again about these medication. Pt is stating the pharmacy has not received the proscriptions. Please advise.

## 2019-10-11 NOTE — Telephone Encounter (Signed)
What is the name of the medication? glipiZIDE (GLUCOTROL) 5 MG tablet [[id number]] and sildenafil (VIAGRA) 100 MG tablet [[id number]]    Have you contacted your pharmacy to request a refill? He is saying that [facility] has not gotten his script because he has checked several times. It looks like pcp sent the script over on 10/05/19 at 2:32pm.   Which pharmacy would you like this sent to? Pharmacy  [id number] [place], [place] - [phone] AT [facility] & [address]  [address] [place] [address]  Phone:  [phone] Fax:  [phone]       Patient notified that their request is being sent to the clinical staff for review and that they should receive a call once it is complete. If they do not receive a call within 72 hours they can check with their pharmacy or our office.

## 2019-12-21 ENCOUNTER — Telehealth: Payer: Self-pay | Admitting: Emergency Medicine

## 2019-12-21 NOTE — Telephone Encounter (Signed)
Referral followup 

## 2020-01-04 ENCOUNTER — Ambulatory Visit (INDEPENDENT_AMBULATORY_CARE_PROVIDER_SITE_OTHER): Payer: Self-pay | Admitting: Emergency Medicine

## 2020-01-04 ENCOUNTER — Encounter: Payer: Self-pay | Admitting: Emergency Medicine

## 2020-01-04 ENCOUNTER — Other Ambulatory Visit: Payer: Self-pay

## 2020-01-04 VITALS — BP 136/91 | HR 89 | Temp 97.1°F | Resp 16 | Ht 64.5 in | Wt 168.0 lb

## 2020-01-04 DIAGNOSIS — E1165 Type 2 diabetes mellitus with hyperglycemia: Secondary | ICD-10-CM

## 2020-01-04 DIAGNOSIS — E1169 Type 2 diabetes mellitus with other specified complication: Secondary | ICD-10-CM

## 2020-01-04 DIAGNOSIS — E785 Hyperlipidemia, unspecified: Secondary | ICD-10-CM

## 2020-01-04 DIAGNOSIS — N529 Male erectile dysfunction, unspecified: Secondary | ICD-10-CM

## 2020-01-04 LAB — POCT GLYCOSYLATED HEMOGLOBIN (HGB A1C): Hemoglobin A1C: 7.3 % — AB (ref 4.0–5.6)

## 2020-01-04 LAB — GLUCOSE, POCT (MANUAL RESULT ENTRY): POC Glucose: 220 mg/dl — AB (ref 70–99)

## 2020-01-04 MED ORDER — GLIPIZIDE 5 MG PO TABS: 5.0000 mg | ORAL_TABLET | Freq: Two times a day (BID) | ORAL | 3 refills | Status: DC

## 2020-01-04 MED ORDER — SILDENAFIL CITRATE 100 MG PO TABS: 50.0000 mg | ORAL_TABLET | Freq: Every day | ORAL | 11 refills | Status: DC | PRN

## 2020-01-04 NOTE — Progress Notes (Signed)
Arthur Cox 39 y.o.   Chief Complaint  Patient presents with  . Diabetes    follow up 3 month     HISTORY OF PRESENT ILLNESS: This is a 39 y.o. male with history of diabetes here for follow-up. Presently on Metformin 1000 mg twice a day.  Was supposed to be taking glipizide 5 mg twice a day but never got the prescription. History of dyslipidemia, on rosuvastatin 20 mg daily. History of erectile dysfunction, never received prescription for sildenafil.  Looks like prescriptions were sent to the wrong [place] pharmacy. Has no complaints or medical concerns today. Lab Results  Component Value Date   HGBA1C 9.8 (A) 08/30/2019   BP Readings from Last 3 Encounters:  01/04/20 (!) 136/91  10/05/19 112/75  08/30/19 123/81  Had Covid infection earlier this year with positive natural immunity.   HPI   Prior to Admission medications   Medication Sig Start Date End Date Taking? Authorizing Provider  glipiZIDE (GLUCOTROL) 5 MG tablet Take 1 tablet (5 mg total) by mouth 2 (two) times daily with a meal. 10/05/19 01/03/20  [name], MD  metFORMIN (GLUCOPHAGE) 1000 MG tablet Take 1 tablet (1,000 mg total) by mouth 2 (two) times daily with a meal. 10/11/19   Ekam Bonebrake, [name], MD  rosuvastatin (CRESTOR) 20 MG tablet Take 1 tablet (20 mg total) by mouth daily. 10/11/19   [name], MD  sildenafil (VIAGRA) 100 MG tablet Take 0.5-1 tablets (50-100 mg total) by mouth daily as needed for erectile dysfunction. 10/05/19   [name], MD    No Known Allergies  Patient Active Problem List   Diagnosis Date Noted  . Type II or unspecified type diabetes mellitus without mention of complication, uncontrolled 12/17/2012  . HTN (hypertension) 12/17/2012    Past Medical History:  Diagnosis Date  . Asthma   . Cellulitis 12/17/2012   RIGHT FOOT  . Diabetes mellitus     Past Surgical History:  Procedure Laterality Date  . NO PAST SURGERIES      Social  History   Socioeconomic History  . Marital status: Married    Spouse name: Not on file  . Number of children: Not on file  . Years of education: Not on file  . Highest education level: Not on file  Occupational History  . Not on file  Tobacco Use  . Smoking status: Never Smoker  . Smokeless tobacco: Never Used  Substance and Sexual Activity  . Alcohol use: Yes    Comment: ONCE  A WEEK  . Drug use: No  . Sexual activity: Not on file  Other Topics Concern  . Not on file  Social History Narrative  . Not on file   Social Determinants of Health   Financial Resource Strain:   . Difficulty of Paying Living Expenses:   Food Insecurity:   . Worried About Programme researcher, broadcasting/film/video in the Last Year:   . Barista in the Last Year:   Transportation Needs:   . Freight forwarder (Medical):   Marland Kitchen Lack of Transportation (Non-Medical):   Physical Activity:   . Days of Exercise per Week:   . Minutes of Exercise per Session:   Stress:   . Feeling of Stress :   Social Connections:   . Frequency of Communication with Friends and Family:   . Frequency of Social Gatherings with Friends and Family:   . Attends Religious Services:   . Active Member of Clubs  or Organizations:   . Attends Banker Meetings:   Marland Kitchen Marital Status:   Intimate Partner Violence:   . Fear of Current or Ex-Partner:   . Emotionally Abused:   Marland Kitchen Physically Abused:   . Sexually Abused:     History reviewed. No pertinent family history.   Review of Systems  Constitutional: Negative.  Negative for chills and fever.  HENT: Negative.  Negative for congestion and sore throat.   Respiratory: Negative.  Negative for cough and shortness of breath.   Cardiovascular: Negative.  Negative for chest pain and palpitations.  Gastrointestinal: Negative.  Negative for abdominal pain, diarrhea, nausea and vomiting.  Genitourinary: Negative.  Negative for dysuria.  Musculoskeletal: Negative.   Skin: Negative.   Negative for rash.  Neurological: Negative for dizziness and headaches.  All other systems reviewed and are negative.  Today's Vitals   01/04/20 0814  BP: (!) 136/91  Pulse: 89  Resp: 16  Temp: (!) 97.1 F (36.2 C)  TempSrc: Temporal  SpO2: 99%  Weight: 168 lb (76.2 kg)  Height: 5' 4.5" (1.638 m)   Body mass index is 28.39 kg/m.   Physical Exam Vitals reviewed.  Constitutional:      Appearance: Normal appearance.  HENT:     Head: Normocephalic.  Eyes:     Extraocular Movements: Extraocular movements intact.     Conjunctiva/sclera: Conjunctivae normal.     Pupils: Pupils are equal, round, and reactive to light.  Cardiovascular:     Rate and Rhythm: Normal rate and regular rhythm.     Pulses: Normal pulses.     Heart sounds: Normal heart sounds.  Pulmonary:     Effort: Pulmonary effort is normal.     Breath sounds: Normal breath sounds.  Abdominal:     Palpations: Abdomen is soft.     Tenderness: There is no abdominal tenderness.  Musculoskeletal:        General: Normal range of motion.     Cervical back: Normal range of motion and neck supple.  Skin:    General: Skin is warm and dry.  Neurological:     General: No focal deficit present.     Mental Status: He is alert and oriented to person, place, and time.  Psychiatric:        Mood and Affect: Mood normal.        Behavior: Behavior normal.    Results for orders placed or performed in visit on 01/04/20 (from the past 24 hour(s))  POCT glucose (manual entry)     Status: Abnormal   Collection Time: 01/04/20  8:37 AM  Result Value Ref Range   POC Glucose 220 (A) 70 - 99 mg/dl  POCT glycosylated hemoglobin (Hb A1C)     Status: Abnormal   Collection Time: 01/04/20  8:38 AM  Result Value Ref Range   Hemoglobin A1C 7.3 (A) 4.0 - 5.6 %   HbA1c POC (<> result, manual entry)     HbA1c, POC (prediabetic range)     HbA1c, POC (controlled diabetic range)       ASSESSMENT & PLAN: Type 2 diabetes mellitus with  hyperglycemia, without long-term current use of insulin (HCC) Improved diabetes with hemoglobin A1c better than before at 7.3.  Continue Metformin 1000 mg twice a day and continue glipizide 5 mg twice a day.  Diet and nutrition discussed. Continue statin therapy with rosuvastatin 20 mg daily. Follow-up in 3 months.  Arthur Cox was seen today for diabetes and medication refill.  Diagnoses  and all orders for this visit:  Type 2 diabetes mellitus with hyperglycemia, without long-term current use of insulin (HCC)  Hyperlipidemia associated with type 2 diabetes mellitus (HCC) -     Comprehensive metabolic panel -     Lipid panel -     POCT glucose (manual entry) -     POCT glycosylated hemoglobin (Hb A1C) -     glipiZIDE (GLUCOTROL) 5 MG tablet; Take 1 tablet (5 mg total) by mouth 2 (two) times daily with a meal.  Erectile dysfunction, unspecified erectile dysfunction type -     sildenafil (VIAGRA) 100 MG tablet; Take 0.5-1 tablets (50-100 mg total) by mouth daily as needed for erectile dysfunction.    Patient Instructions       If you have lab work done today you will be contacted with your lab results within the next 2 weeks.  If you have not heard from [place] then please contact us. The fastest way to get your results is to register for My Chart.   IF you received an x-ray today, you will receive an invoice from [facility]. Please contact [facility] at [phone] with questions or concerns regarding your invoice.   IF you received labwork today, you will receive an invoice from [facility]. Please contact [facility] at [phone] with questions or concerns regarding your invoice.   Our billing staff will not be able to assist you with questions regarding bills from these companies.  You will be contacted with the lab results as soon as they are available. The fastest way to get your results is to activate your My Chart account. Instructions are located on the last page of  this paperwork. If you have not heard from [place] regarding the results in 2 weeks, please contact this office.     Diabetes mellitus y nutricin, en adultos Diabetes Mellitus and Nutrition, Adult Si sufre de diabetes (diabetes mellitus), es muy importante tener hbitos alimenticios saludables debido a que sus niveles de Psychologist, counselling sangre (glucosa) se ven afectados en gran medida por lo que come y bebe. [place] alimentos saludables en las cantidades [place], aproximadamente a la [employer], [place] ayudar a:  Scientist, physiological glucemia.  Disminuir el riesgo de sufrir una enfermedad cardaca.  Mejorar la presin arterial.  Barista o mantener un peso saludable. Todas las personas que sufren de diabetes son diferentes y cada una tiene necesidades diferentes en cuanto a un plan de alimentacin. El mdico puede recomendarle que trabaje con un especialista en dietas y nutricin (nutricionista) para Tax adviser plan para usted. Su plan de alimentacin puede variar segn factores como:  Las caloras que necesita.  Los medicamentos que toma.  Su peso.  Sus niveles de glucemia, presin arterial y colesterol.  Su nivel de [place].  Otras afecciones que tenga, como enfermedades cardacas o renales. Cmo me afectan los carbohidratos? Los carbohidratos, o hidratos de carbono, afectan su nivel de glucemia ms que cualquier otro tipo de alimento. La ingesta de carbohidratos naturalmente aumenta la cantidad de [employer]. El recuento de carbohidratos es un mtodo destinado a Midwife un registro de la cantidad de carbohidratos que se consumen. El recuento de carbohidratos es importante para Pharmacologist la glucemia a un nivel saludable, especialmente si utiliza insulina o toma determinados medicamentos por va oral para la diabetes. Es importante conocer la cantidad de carbohidratos que se pueden ingerir en cada comida sin correr Surveyor, minerals. Esto es Government social research officer. Su  nutricionista  puede ayudarlo a calcular la cantidad de carbohidratos que debe ingerir en cada comida y en cada refrigerio. Entre los alimentos que contienen carbohidratos, se incluyen:  Pan, cereal, arroz, pastas y galletas.  Papas y maz.  Guisantes, frijoles y lentejas.  Leche y Dentistyogur.  Nils PyleFrutas y Sloveniajugo.  Postres, como pasteles, galletas, helado y caramelos. Cmo me afecta el alcohol? El alcohol puede provocar disminuciones sbitas de la glucemia (hipoglucemia), especialmente si utiliza insulina o toma determinados medicamentos por va oral para la diabetes. La hipoglucemia es una afeccin potencialmente mortal. Los sntomas de la hipoglucemia (somnolencia, mareos y confusin) son similares a los sntomas de haber consumido demasiado alcohol. Si el mdico afirma que el alcohol es seguro para usted, Mainesiga estas pautas:  Limite el consumo de alcohol a no ms de 1medida por da si es mujer y no est [place]embarazada, y a 2medidas si es hombre. Una medida equivale a 12oz (355ml) de cerveza, 5oz (148ml) de vino o 1oz (44ml) de bebidas alcohlicas de alta graduacin.  No beba con el estmago vaco.  Mantngase hidratado bebiendo agua, refrescos dietticos o t helado sin azcar.  Tenga en cuenta que los refrescos comunes, los jugos y otras bebida para Engineer, manufacturingmezclar pueden contener mucha azcar y se deben contar como carbohidratos. Cules son algunos consejos para seguir este plan?  Leer las etiquetas de los alimentos  Comience por leer el tamao de la porcin en la "Informacin nutricional" en las etiquetas de los alimentos envasados y las bebidas. La cantidad de caloras, carbohidratos, grasas y otros nutrientes mencionados en la etiqueta se basan en una porcin del alimento. Muchos alimentos contienen ms de una porcin por envase.  Verifique la cantidad total de gramos (g) de carbohidratos totales en una porcin. Puede calcular la cantidad de porciones de carbohidratos al dividir el total de  carbohidratos por 15. Por ejemplo, si un alimento tiene un total de 30g de carbohidratos, equivale a 2 porciones de carbohidratos.  Verifique la cantidad de gramos (g) de grasas saturadas y grasas trans en una porcin. Escoja alimentos que no contengan grasa o que tengan un bajo contenido.  Verifique la cantidad de miligramos (mg) de sal (sodio) en una porcin. La mayora de las personas deben limitar la ingesta de sodio total a menos de 2300mg  por Futures traderda.  Siempre consulte la informacin nutricional de los alimentos etiquetados como "con bajo contenido de grasa" o "sin grasa". Estos alimentos pueden tener un mayor contenido de International aid/development workerazcar agregada o carbohidratos refinados, y deben evitarse.  Hable con su nutricionista para identificar sus objetivos diarios en cuanto a los nutrientes mencionados en la etiqueta. Al ir de compras  Evite comprar alimentos procesados, enlatados o precocinados. Estos alimentos tienden a Counselling psychologisttener una mayor cantidad de Rose Budgrasa, sodio y azcar agregada.  Compre en la zona exterior de la tienda de comestibles. Esta zona incluye frutas y verduras frescas, granos a granel, carnes frescas y productos lcteos frescos. Al cocinar  Utilice mtodos de coccin a baja temperatura, como hornear, en lugar de mtodos de coccin a alta temperatura, como frer en abundante aceite.  Cocine con aceites saludables, como el aceite de [place]oliva, canola o Luraygirasol.  Evite cocinar con manteca, crema o carnes con alto contenido de grasa. Planificacin de las comidas  Coma las comidas y los refrigerios regularmente, preferentemente a la misma hora todos Tolstoylos das. Evite pasar largos perodos de tiempo sin comer.  Consuma alimentos ricos en fibra, como frutas frescas, verduras, frijoles y cereales integrales. Consulte a su nutricionista sobre cuntas  porciones de carbohidratos puede consumir en cada comida.  Consuma entre 4 y 6 onzas (oz) de protenas magras por da, como carnes [place], pollo, pescado,  huevos o tofu. Una onza de protena magra equivale a: ? 1 onza de carne, pollo o pescado. ? 1huevo. ?  taza de tofu.  Coma algunos alimentos por da que contengan grasas saludables, como aguacates, frutos secos, semillas y pescado. Estilo de vida  Controle su nivel de glucemia con regularidad.  Haga actividad fsica habitualmente como se lo haya indicado el mdico. Esto puede incluir lo siguiente: ? semanales de ejercicio de intensidad moderada o alta. Esto podra incluir caminatas dinmicas, ciclismo o gimnasia acutica. ? Realizar ejercicios de elongacin y de fortalecimiento, como yoga o levantamiento de pesas, por lo menos 2veces por semana.  Tome los [employer] se lo haya indicado el mdico.  No consuma ningn producto que contenga nicotina o tabaco, como cigarrillos y Administrator, Civil Service. Si necesita ayuda para dejar de fumar, consulte al C[employer] con un asesor o instructor en diabetes para identificar estrategias para controlar el estrs y cualquier desafo emocional y social. Preguntas para hacerle al mdico  Es necesario que consulte a IT trainer en el cuidado de la diabetes?  Es necesario que me rena con un nutricionista?  A qu nmero puedo llamar si tengo preguntas?  Cules son los mejores momentos para controlar la glucemia? Dnde encontrar ms informacin:  Asociacin Estadounidense de la Diabetes (American Diabetes Association): diabetes.org  Academia de Nutricin y Pension scheme manager (Academy of Nutrition and Dietetics): www.eatright.org  [employer] de la Diabetes y las Enfermedades Digestivas y [facility] of Diabetes and Digestive and Kidney Diseases, NIH): CarFlippers.tn Resumen  Un plan de alimentacin saludable lo ayudar a Scientist, physiological glucemia y Pharmacologist un estilo de vida saludable.  Trabajar con un especialista en dietas y nutricin (nutricionista) puede ayudarlo a Designer, television/film set de  alimentacin para usted.  Tenga en cuenta que los carbohidratos (hidratos de carbono) y el alcohol tienen efectos inmediatos en sus niveles de glucemia. Es importante contar los carbohidratos que ingiere y consumir alcohol con prudencia. Esta informacin no tiene Theme park manager el consejo del mdico. Asegrese de hacerle al mdico cualquier pregunta que tenga. Document Revised: 01/28/2017 Document Reviewed: 09/09/2016 Elsevier Patient Education  2020 Elsevier Inc.     Edwina Barth, MD [facility]

## 2020-01-04 NOTE — Patient Instructions (Addendum)
   If you have lab work done today you will be contacted with your lab results within the next 2 weeks.  If you have not heard from us then please contact us. The fastest way to get your results is to register for My Chart.   IF you received an x-ray today, you will receive an invoice from [facility]. Please contact [facility] at [phone] with questions or concerns regarding your invoice.   IF you received labwork today, you will receive an invoice from [facility]. Please contact [facility] at [phone] with questions or concerns regarding your invoice.   Our billing staff will not be able to assist you with questions regarding bills from these companies.  You will be contacted with the lab results as soon as they are available. The fastest way to get your results is to activate your My Chart account. Instructions are located on the last page of this paperwork. If you have not heard from us regarding the results in 2 weeks, please contact this office.     Diabetes mellitus y nutricin, en adultos Diabetes Mellitus and Nutrition, Adult Si sufre de diabetes (diabetes mellitus), es muy importante tener hbitos alimenticios saludables debido a que sus niveles de azcar en la sangre (glucosa) se ven afectados en gran medida por lo que come y bebe. [place]er alimentos saludables en las cantidades adecuadas, aproximadamente a la misma hora todos los das, lo ayudar a:  Controlar la glucemia.  Disminuir el riesgo de sufrir una enfermedad cardaca.  Mejorar la presin arterial.  Alcanzar o mantener un peso saludable. Todas las personas que sufren de diabetes son diferentes y cada una tiene necesidades diferentes en cuanto a un plan de alimentacin. El mdico puede recomendarle que trabaje con un especialista en dietas y nutricin (nutricionista) para elaborar el mejor plan para usted. Su plan de alimentacin puede variar segn factores como:  Las caloras que  necesita.  Los medicamentos que toma.  Su peso.  Sus niveles de glucemia, presin arterial y colesterol.  Su nivel de actividad.  Otras afecciones que tenga, como enfermedades cardacas o renales. Cmo me afectan los carbohidratos? Los carbohidratos, o hidratos de carbono, afectan su nivel de glucemia ms que cualquier otro tipo de alimento. La ingesta de carbohidratos naturalmente aumenta la cantidad de glucosa en la sangre. El recuento de carbohidratos es un mtodo destinado a llevar un registro de la cantidad de carbohidratos que se consumen. El recuento de carbohidratos es importante para mantener la glucemia a un nivel saludable, especialmente si utiliza insulina o toma determinados medicamentos por va oral para la diabetes. Es importante conocer la cantidad de carbohidratos que se pueden ingerir en cada comida sin correr ningn riesgo. Esto es diferente en cada persona. Su nutricionista puede ayudarlo a calcular la cantidad de carbohidratos que debe ingerir en cada comida y en cada refrigerio. Entre los alimentos que contienen carbohidratos, se incluyen:  Pan, cereal, arroz, pastas y galletas.  Papas y maz.  Guisantes, frijoles y lentejas.  Leche y yogur.  Frutas y jugo.  Postres, como pasteles, galletas, helado y caramelos. Cmo me afecta el alcohol? El alcohol puede provocar disminuciones sbitas de la glucemia (hipoglucemia), especialmente si utiliza insulina o toma determinados medicamentos por va oral para la diabetes. La hipoglucemia es una afeccin potencialmente mortal. Los sntomas de la hipoglucemia (somnolencia, mareos y confusin) son similares a los sntomas de haber consumido demasiado alcohol. Si el mdico afirma que el alcohol es seguro para usted, siga estas pautas:    Limite el consumo de alcohol a no ms de 1medida por da si es mujer y no est embarazada, y a 2medidas si es hombre. Una medida equivale a 12oz (355ml) de cerveza, 5oz (148ml) de vino o  1oz (44ml) de bebidas alcohlicas de alta graduacin.  No beba con el estmago vaco.  Mantngase hidratado bebiendo agua, refrescos dietticos o t helado sin azcar.  Tenga en cuenta que los refrescos comunes, los jugos y otras bebida para mezclar pueden contener mucha azcar y se deben contar como carbohidratos. Cules son algunos consejos para seguir este plan?  Leer las etiquetas de los alimentos  Comience por leer el tamao de la porcin en la "Informacin nutricional" en las etiquetas de los alimentos envasados y las bebidas. La cantidad de caloras, carbohidratos, grasas y otros nutrientes mencionados en la etiqueta se basan en una porcin del alimento. Muchos alimentos contienen ms de una porcin por envase.  Verifique la cantidad total de gramos (g) de carbohidratos totales en una porcin. Puede calcular la cantidad de porciones de carbohidratos al dividir el total de carbohidratos por 15. Por ejemplo, si un alimento tiene un total de 30g de carbohidratos, equivale a 2 porciones de carbohidratos.  Verifique la cantidad de gramos (g) de grasas saturadas y grasas trans en una porcin. Escoja alimentos que no contengan grasa o que tengan un bajo contenido.  Verifique la cantidad de miligramos (mg) de sal (sodio) en una porcin. La mayora de las personas deben limitar la ingesta de sodio total a menos de 2300mg por da.  Siempre consulte la informacin nutricional de los alimentos etiquetados como "con bajo contenido de grasa" o "sin grasa". Estos alimentos pueden tener un mayor contenido de azcar agregada o carbohidratos refinados, y deben evitarse.  Hable con su nutricionista para identificar sus objetivos diarios en cuanto a los nutrientes mencionados en la etiqueta. Al ir de compras  Evite comprar alimentos procesados, enlatados o precocinados. Estos alimentos tienden a tener una mayor cantidad de grasa, sodio y azcar agregada.  Compre en la zona exterior de la tienda de  comestibles. Esta zona incluye frutas y verduras frescas, granos a granel, carnes frescas y productos lcteos frescos. Al cocinar  Utilice mtodos de coccin a baja temperatura, como hornear, en lugar de mtodos de coccin a alta temperatura, como frer en abundante aceite.  Cocine con aceites saludables, como el aceite de oliva, canola o girasol.  Evite cocinar con manteca, crema o carnes con alto contenido de grasa. Planificacin de las comidas  Coma las comidas y los refrigerios regularmente, preferentemente a la misma hora todos los das. Evite pasar largos perodos de tiempo sin comer.  Consuma alimentos ricos en fibra, como frutas frescas, verduras, frijoles y cereales integrales. Consulte a su nutricionista sobre cuntas porciones de carbohidratos puede consumir en cada comida.  Consuma entre 4 y 6 onzas (oz) de protenas magras por da, como carnes magras, pollo, pescado, huevos o tofu. Una onza de protena magra equivale a: ? 1 onza de carne, pollo o pescado. ? 1huevo. ?  taza de tofu.  Coma algunos alimentos por da que contengan grasas saludables, como aguacates, frutos secos, semillas y pescado. Estilo de vida  Controle su nivel de glucemia con regularidad.  Haga actividad fsica habitualmente como se lo haya indicado el mdico. Esto puede incluir lo siguiente: ? 150minutos semanales de ejercicio de intensidad moderada o alta. Esto podra incluir caminatas dinmicas, ciclismo o gimnasia acutica. ? Realizar ejercicios de elongacin y de fortalecimiento, como yoga o levantamiento   de pesas, por lo menos 2veces por semana.  Tome los medicamentos como se lo haya indicado el mdico.  No consuma ningn producto que contenga nicotina o tabaco, como cigarrillos y cigarrillos electrnicos. Si necesita ayuda para dejar de fumar, consulte al mdico.  Trabaje con un asesor o instructor en diabetes para identificar estrategias para controlar el estrs y cualquier desafo emocional  y social. Preguntas para hacerle al mdico  Es necesario que consulte a un instructor en el cuidado de la diabetes?  Es necesario que me rena con un nutricionista?  A qu nmero puedo llamar si tengo preguntas?  Cules son los mejores momentos para controlar la glucemia? Dnde encontrar ms informacin:  Asociacin Estadounidense de la Diabetes (American Diabetes Association): diabetes.org  Academia de Nutricin y Diettica (Academy of Nutrition and Dietetics): www.eatright.org  Instituto Nacional de la Diabetes y las Enfermedades Digestivas y Renales (National Institute of Diabetes and Digestive and Kidney Diseases, NIH): www.niddk.nih.gov Resumen  Un plan de alimentacin saludable lo ayudar a controlar la glucemia y mantener un estilo de vida saludable.  Trabajar con un especialista en dietas y nutricin (nutricionista) puede ayudarlo a elaborar el mejor plan de alimentacin para usted.  Tenga en cuenta que los carbohidratos (hidratos de carbono) y el alcohol tienen efectos inmediatos en sus niveles de glucemia. Es importante contar los carbohidratos que ingiere y consumir alcohol con prudencia. Esta informacin no tiene como fin reemplazar el consejo del mdico. Asegrese de hacerle al mdico cualquier pregunta que tenga. Document Revised: 01/28/2017 Document Reviewed: 09/09/2016 Elsevier Patient Education  2020 [employer]  

## 2020-01-04 NOTE — Assessment & Plan Note (Signed)
Improved diabetes with hemoglobin A1c better than before at 7.3.  Continue Metformin 1000 mg twice a day and continue glipizide 5 mg twice a day.  Diet and nutrition discussed. Continue statin therapy with rosuvastatin 20 mg daily. Follow-up in 3 months.

## 2020-01-05 LAB — LIPID PANEL
Chol/HDL Ratio: 3.6 ratio (ref 0.0–5.0)
Cholesterol, Total: 119 mg/dL (ref 100–199)
HDL: 33 mg/dL — ABNORMAL LOW (ref >39)
LDL Chol Calc (NIH): 55 mg/dL (ref 0–99)
Triglycerides: 185 mg/dL — ABNORMAL HIGH (ref 0–149)
VLDL Cholesterol Cal: 31 mg/dL (ref 5–40)

## 2020-01-05 LAB — COMPREHENSIVE METABOLIC PANEL
ALT: 35 IU/L (ref 0–44)
AST: 20 IU/L (ref 0–40)
Albumin/Globulin Ratio: 2.1 (ref 1.2–2.2)
Albumin: 4.7 g/dL (ref 4.0–5.0)
Alkaline Phosphatase: 84 IU/L (ref 48–121)
BUN/Creatinine Ratio: 17 (ref 9–20)
BUN: 13 mg/dL (ref 6–20)
Bilirubin Total: 0.4 mg/dL (ref 0.0–1.2)
CO2: 20 mmol/L (ref 20–29)
Calcium: 9.7 mg/dL (ref 8.7–10.2)
Chloride: 102 mmol/L (ref 96–106)
Creatinine, Ser: 0.76 mg/dL (ref 0.76–1.27)
GFR calc Af Amer: 133 mL/min/{1.73_m2} (ref >59)
GFR calc non Af Amer: 115 mL/min/{1.73_m2} (ref >59)
Globulin, Total: 2.2 g/dL (ref 1.5–4.5)
Glucose: 223 mg/dL — ABNORMAL HIGH (ref 65–99)
Potassium: 4.6 mmol/L (ref 3.5–5.2)
Sodium: 138 mmol/L (ref 134–144)
Total Protein: 6.9 g/dL (ref 6.0–8.5)

## 2020-04-17 ENCOUNTER — Encounter: Payer: Self-pay | Admitting: Emergency Medicine

## 2020-04-17 ENCOUNTER — Ambulatory Visit (INDEPENDENT_AMBULATORY_CARE_PROVIDER_SITE_OTHER): Payer: Self-pay | Admitting: Emergency Medicine

## 2020-04-17 ENCOUNTER — Other Ambulatory Visit: Payer: Self-pay

## 2020-04-17 VITALS — BP 125/70 | HR 77 | Temp 98.6°F | Resp 16 | Ht 64.5 in | Wt 177.0 lb

## 2020-04-17 DIAGNOSIS — E1165 Type 2 diabetes mellitus with hyperglycemia: Secondary | ICD-10-CM

## 2020-04-17 DIAGNOSIS — E1169 Type 2 diabetes mellitus with other specified complication: Secondary | ICD-10-CM | POA: Insufficient documentation

## 2020-04-17 DIAGNOSIS — E785 Hyperlipidemia, unspecified: Secondary | ICD-10-CM

## 2020-04-17 LAB — POCT GLYCOSYLATED HEMOGLOBIN (HGB A1C): Hemoglobin A1C: 6.6 % — AB (ref 4.0–5.6)

## 2020-04-17 LAB — GLUCOSE, POCT (MANUAL RESULT ENTRY): POC Glucose: 163 mg/dl — AB (ref 70–99)

## 2020-04-17 MED ORDER — GLIPIZIDE 5 MG PO TABS: 5.0000 mg | ORAL_TABLET | Freq: Two times a day (BID) | ORAL | 3 refills | Status: DC

## 2020-04-17 MED ORDER — METFORMIN HCL 1000 MG PO TABS: 1000.0000 mg | ORAL_TABLET | Freq: Two times a day (BID) | ORAL | 3 refills | Status: DC

## 2020-04-17 MED ORDER — ROSUVASTATIN CALCIUM 20 MG PO TABS: 20.0000 mg | ORAL_TABLET | Freq: Every day | ORAL | 3 refills | Status: DC

## 2020-04-17 NOTE — Assessment & Plan Note (Signed)
Much improved diabetes with hemoglobin A1c of 6.6.  Continue glipizide and Metformin.  Diet and nutrition discussed.  Follow-up in 3 months. Continue rosuvastatin 20 mg daily.

## 2020-04-17 NOTE — Progress Notes (Signed)
Arthur Cox 39 y.o.   Chief Complaint  Patient presents with  . Diabetes    follow up   . Medication Refill    pend    HISTORY OF PRESENT ILLNESS: This is a 39 y.o. male with history of diabetes here for follow-up and medication refill. Doing well.  Has no complaints or medical concerns. Presently taking glipizide 5 mg twice a day and Metformin 1000 mg twice a day.  Also takes rosuvastatin 20 mg daily. Status post Covid infection earlier this year followed by vaccination.  HPI   Prior to Admission medications   Medication Sig Start Date End Date Taking? Authorizing Provider  metFORMIN (GLUCOPHAGE) 1000 MG tablet Take 1 tablet (1,000 mg total) by mouth 2 (two) times daily with a meal. 10/11/19  Yes Arthur Cox, Arthur Kempf, MD  rosuvastatin (CRESTOR) 20 MG tablet Take 1 tablet (20 mg total) by mouth daily. 10/11/19  Yes Arthur Cox, Arthur Kempf, MD  sildenafil (VIAGRA) 100 MG tablet Take 0.5-1 tablets (50-100 mg total) by mouth daily as needed for erectile dysfunction. 01/04/20  Yes Arthur Cox, Arthur Kempf, MD  glipiZIDE (GLUCOTROL) 5 MG tablet Take 1 tablet (5 mg total) by mouth 2 (two) times daily with a meal. 01/04/20 04/03/20  Arthur Quint, MD    No Known Allergies  Patient Active Problem List   Diagnosis Date Noted  . Type 2 diabetes mellitus with hyperglycemia, without long-term current use of insulin (HCC) 12/17/2012  . HTN (hypertension) 12/17/2012    Past Medical History:  Diagnosis Date  . Asthma   . Cellulitis 12/17/2012   RIGHT FOOT  . Diabetes mellitus     Past Surgical History:  Procedure Laterality Date  . NO PAST SURGERIES      Social History   Socioeconomic History  . Marital status: Married    Spouse name: Not on file  . Number of children: Not on file  . Years of education: Not on file  . Highest education level: Not on file  Occupational History  . Not on file  Tobacco Use  . Smoking status: Never Smoker  . Smokeless tobacco: Never  Used  Substance and Sexual Activity  . Alcohol use: Yes    Comment: ONCE  A WEEK  . Drug use: No  . Sexual activity: Not on file  Other Topics Concern  . Not on file  Social History Narrative  . Not on file   Social Determinants of Health   Financial Resource Strain:   . Difficulty of Paying Living Expenses: Not on file  Food Insecurity:   . Worried About Programme researcher, broadcasting/film/video in the Last Year: Not on file  . Ran Out of Food in the Last Year: Not on file  Transportation Needs:   . Lack of Transportation (Medical): Not on file  . Lack of Transportation (Non-Medical): Not on file  Physical Activity:   . Days of Exercise per Week: Not on file  . Minutes of Exercise per Session: Not on file  Stress:   . Feeling of Stress : Not on file  Social Connections:   . Frequency of Communication with Friends and Family: Not on file  . Frequency of Social Gatherings with Friends and Family: Not on file  . Attends Religious Services: Not on file  . Active Member of Clubs or Organizations: Not on file  . Attends Banker Meetings: Not on file  . Marital Status: Not on file  Intimate Partner Violence:   .  Fear of Current or Ex-Partner: Not on file  . Emotionally Abused: Not on file  . Physically Abused: Not on file  . Sexually Abused: Not on file    No family history on file.   Review of Systems  Constitutional: Negative.  Negative for chills and fever.  HENT: Negative.  Negative for congestion and sore throat.   Eyes: Negative.   Respiratory: Negative.  Negative for cough and shortness of breath.   Cardiovascular: Negative.  Negative for chest pain and palpitations.  Gastrointestinal: Negative.  Negative for abdominal pain, nausea and vomiting.  Genitourinary: Negative.  Negative for dysuria and hematuria.  Musculoskeletal: Negative.  Negative for myalgias and neck pain.  Skin: Negative.   Neurological: Negative.  Negative for dizziness and headaches.  All other systems  reviewed and are negative.    Today's Vitals   04/17/20 0940 04/17/20 1035  BP: (!) 152/70 125/70  Pulse: 77   Resp: 16   Temp: 98.6 F (37 C)   TempSrc: Temporal   SpO2: 99%   Weight: 177 lb (80.3 kg)   Height: 5' 4.5" (1.638 m)    Body mass index is 29.91 kg/m. Wt Readings from Last 3 Encounters:  04/17/20 177 lb (80.3 kg)  01/04/20 168 lb (76.2 kg)  10/05/19 170 lb (77.1 kg)     Physical Exam Vitals reviewed.  Constitutional:      Appearance: Normal appearance.  HENT:     Head: Normocephalic.  Eyes:     Extraocular Movements: Extraocular movements intact.     Conjunctiva/sclera: Conjunctivae normal.     Pupils: Pupils are equal, round, and reactive to light.  Cardiovascular:     Rate and Rhythm: Normal rate and regular rhythm.     Pulses: Normal pulses.     Heart sounds: Normal heart sounds.  Pulmonary:     Effort: Pulmonary effort is normal.     Breath sounds: Normal breath sounds.  Musculoskeletal:        General: Normal range of motion.     Cervical back: Normal range of motion and neck supple.  Skin:    General: Skin is warm and dry.     Capillary Refill: Capillary refill takes less than 2 seconds.  Neurological:     General: No focal deficit present.     Mental Status: He is alert and oriented to person, place, and time.  Psychiatric:        Mood and Affect: Mood normal.        Behavior: Behavior normal.    Results for orders placed or performed in visit on 04/17/20 (from the past 24 hour(s))  POCT glucose (manual entry)     Status: Abnormal   Collection Time: 04/17/20 10:09 AM  Result Value Ref Range   POC Glucose 163 (A) 70 - 99 mg/dl  POCT glycosylated hemoglobin (Hb A1C)     Status: Abnormal   Collection Time: 04/17/20 10:12 AM  Result Value Ref Range   Hemoglobin A1C 6.6 (A) 4.0 - 5.6 %   HbA1c POC (<> result, manual entry)     HbA1c, POC (prediabetic range)     HbA1c, POC (controlled diabetic range)       ASSESSMENT & PLAN:  Type 2  diabetes mellitus with hyperglycemia, without long-term current use of insulin (HCC) Much improved diabetes with hemoglobin A1c of 6.6.  Continue glipizide and Metformin.  Diet and nutrition discussed.  Follow-up in 3 months. Continue rosuvastatin 20 mg daily.  [name] was seen  today for diabetes and medication refill.  Diagnoses and all orders for this visit:  Type 2 diabetes mellitus with hyperglycemia, without long-term current use of insulin (HCC) -     metFORMIN (GLUCOPHAGE) 1000 MG tablet; Take 1 tablet (1,000 mg total) by mouth 2 (two) times daily with a meal.  Hyperlipidemia associated with type 2 diabetes mellitus (HCC) -     POCT glucose (manual entry) -     POCT glycosylated hemoglobin (Hb A1C) -     glipiZIDE (GLUCOTROL) 5 MG tablet; Take 1 tablet (5 mg total) by mouth 2 (two) times daily with a meal. -     rosuvastatin (CRESTOR) 20 MG tablet; Take 1 tablet (20 mg total) by mouth daily. -     Ambulatory referral to Ophthalmology    Patient Instructions       If you have lab work done today you will be contacted with your lab results within the next 2 weeks.  If you have not heard from [place] then please contact us. The fastest way to get your results is to register for My Chart.   IF you received an x-ray today, you will receive an invoice from [facility]. Please contact [facility] at [phone] with questions or concerns regarding your invoice.   IF you received labwork today, you will receive an invoice from [place]. Please contact [facility] at [phone] with questions or concerns regarding your invoice.   Our billing staff will not be able to assist you with questions regarding bills from these companies.  You will be contacted with the lab results as soon as they are available. The fastest way to get your results is to activate your My Chart account. Instructions are located on the last page of this paperwork. If you have not heard from [place]  regarding the results in 2 weeks, please contact this office.     Diabetes mellitus y nutricin, en adultos Diabetes Mellitus and Nutrition, Adult Si sufre de diabetes (diabetes mellitus), es muy importante tener hbitos alimenticios saludables debido a que sus niveles de Psychologist, counselling sangre (glucosa) se ven afectados en gran medida por lo que come y bebe. [place] alimentos saludables en las cantidades [place], aproximadamente a la [employer], [place] ayudar a:  Scientist, physiological glucemia.  Disminuir el riesgo de sufrir una enfermedad cardaca.  Mejorar la presin arterial.  Barista o mantener un peso saludable. Todas las personas que sufren de diabetes son diferentes y cada una tiene necesidades diferentes en cuanto a un plan de alimentacin. El mdico puede recomendarle que trabaje con un especialista en dietas y nutricin (nutricionista) para Tax adviser plan para usted. Su plan de alimentacin puede variar segn factores como:  Las caloras que necesita.  Los medicamentos que toma.  Su peso.  Sus niveles de glucemia, presin arterial y colesterol.  Su nivel de [place].  Otras afecciones que tenga, como enfermedades cardacas o renales. Cmo me afectan los carbohidratos? Los carbohidratos, o hidratos de carbono, afectan su nivel de glucemia ms que cualquier otro tipo de alimento. La ingesta de carbohidratos naturalmente aumenta la cantidad de [employer]. El recuento de carbohidratos es un mtodo destinado a Midwife un registro de la cantidad de carbohidratos que se consumen. El recuento de carbohidratos es importante para Pharmacologist la glucemia a un nivel saludable, especialmente si utiliza insulina o toma determinados medicamentos por va oral para la diabetes. Es importante conocer la cantidad de carbohidratos que se pueden ingerir en  cada comida sin correr Surveyor, mineralsningn riesgo. Esto es Government social research officerdiferente en cada persona. Su nutricionista puede ayudarlo a calcular la  cantidad de carbohidratos que debe ingerir en cada comida y en cada refrigerio. Entre los alimentos que contienen carbohidratos, se incluyen:  Pan, cereal, arroz, pastas y galletas.  Papas y maz.  Guisantes, frijoles y lentejas.  Leche y Dentistyogur.  Nils PyleFrutas y Sloveniajugo.  Postres, como pasteles, galletas, helado y caramelos. Cmo me afecta el alcohol? El alcohol puede provocar disminuciones sbitas de la glucemia (hipoglucemia), especialmente si utiliza insulina o toma determinados medicamentos por va oral para la diabetes. La hipoglucemia es una afeccin potencialmente mortal. Los sntomas de la hipoglucemia (somnolencia, mareos y confusin) son similares a los sntomas de haber consumido demasiado alcohol. Si el mdico afirma que el alcohol es seguro para usted, Mainesiga estas pautas:  Limite el consumo de alcohol a no ms de 1medida por da si es mujer y no est [place], y a 2medidas si es hombre. Una medida equivale a 12oz (355ml) de cerveza, 5oz (148ml) de vino o 1oz (44ml) de bebidas alcohlicas de alta graduacin.  No beba con el estmago vaco.  Mantngase hidratado bebiendo agua, refrescos dietticos o t helado sin azcar.  Tenga en cuenta que los refrescos comunes, los jugos y otras bebida para Engineer, manufacturingmezclar pueden contener mucha azcar y se deben contar como carbohidratos. Cules son algunos consejos para seguir este plan?  Leer las etiquetas de los alimentos  Comience por leer el tamao de la porcin en la "Informacin nutricional" en las etiquetas de los alimentos envasados y las bebidas. La cantidad de caloras, carbohidratos, grasas y otros nutrientes mencionados en la etiqueta se basan en una porcin del alimento. Muchos alimentos contienen ms de una porcin por envase.  Verifique la cantidad total de gramos (g) de carbohidratos totales en una porcin. Puede calcular la cantidad de porciones de carbohidratos al dividir el total de carbohidratos por 15. Por ejemplo, si un  alimento tiene un total de 30g de carbohidratos, equivale a 2 porciones de carbohidratos.  Verifique la cantidad de gramos (g) de grasas saturadas y grasas trans en una porcin. Escoja alimentos que no contengan grasa o que tengan un bajo contenido.  Verifique la cantidad de miligramos (mg) de sal (sodio) en una porcin. La Harley-Davidsonmayora de las personas deben limitar la ingesta de sodio total a menos de 2300mg  por Futures traderda.  Siempre consulte la informacin nutricional de los alimentos etiquetados como "con bajo contenido de grasa" o "sin grasa". Estos alimentos pueden tener un mayor contenido de International aid/development workerazcar agregada o carbohidratos refinados, y deben evitarse.  Hable con su nutricionista para identificar sus objetivos diarios en cuanto a los nutrientes mencionados en la etiqueta. Al ir de compras  Evite comprar alimentos procesados, enlatados o precocinados. Estos alimentos tienden a Counselling psychologisttener una mayor cantidad de [place], sodio y azcar agregada.  Compre en la zona exterior de la tienda de comestibles. Esta zona incluye frutas y verduras frescas, granos a granel, carnes frescas y productos lcteos frescos. Al cocinar  Utilice mtodos de coccin a baja temperatura, como hornear, en lugar de mtodos de coccin a alta temperatura, como frer en abundante aceite.  Cocine con aceites saludables, como el aceite de Annetta Northoliva, canola o Wentzvillegirasol.  Evite cocinar con manteca, crema o carnes con alto contenido de grasa. Planificacin de las comidas  Coma las comidas y los refrigerios regularmente, preferentemente a la misma hora todos Corcoranlos das. Evite pasar largos perodos de tiempo sin comer.  Consuma alimentos ricos en  fibra, como frutas frescas, verduras, frijoles y cereales integrales. Consulte a su nutricionista sobre cuntas porciones de carbohidratos puede consumir en cada comida.  Consuma entre 4 y 6 onzas (oz) de protenas magras por da, como carnes Pearsall, pollo, pescado, huevos o tofu. Una onza de protena magra  equivale a: ? 1 onza de carne, pollo o pescado. ? 1huevo. ?  taza de tofu.  Coma algunos alimentos por da que contengan grasas saludables, como aguacates, frutos secos, semillas y pescado. Estilo de vida  Controle su nivel de glucemia con regularidad.  Haga actividad fsica habitualmente como se lo haya indicado el mdico. Esto puede incluir lo siguiente: ? semanales de ejercicio de intensidad moderada o alta. Esto podra incluir caminatas dinmicas, ciclismo o gimnasia acutica. ? Realizar ejercicios de elongacin y de fortalecimiento, como yoga o levantamiento de pesas, por lo menos 2veces por semana.  Tome los [employer] se lo haya indicado el mdico.  No consuma ningn producto que contenga nicotina o tabaco, como cigarrillos y Administrator, Civil Service. Si necesita ayuda para dejar de fumar, consulte al CIGNA con un asesor o instructor en diabetes para identificar estrategias para controlar el estrs y cualquier desafo emocional y social. Preguntas para hacerle al mdico  Es necesario que consulte a IT trainer en el cuidado de la diabetes?  Es necesario que me rena con un nutricionista?  A qu nmero puedo llamar si tengo preguntas?  Cules son los mejores momentos para controlar la glucemia? Dnde encontrar ms informacin:  Asociacin Estadounidense de la Diabetes (American Diabetes Association): diabetes.org  Academia de Nutricin y Pension scheme manager (Academy of Nutrition and Dietetics): www.eatright.org  [employer] de la Diabetes y las Enfermedades Digestivas y [facility] of Diabetes and Digestive and Kidney Diseases, NIH): CarFlippers.tn Resumen  Un plan de alimentacin saludable lo ayudar a Scientist, physiological glucemia y Pharmacologist un estilo de vida saludable.  Trabajar con un especialista en dietas y nutricin (nutricionista) puede ayudarlo a Designer, television/film set de alimentacin para usted.  Tenga en cuenta  que los carbohidratos (hidratos de carbono) y el alcohol tienen efectos inmediatos en sus niveles de glucemia. Es importante contar los carbohidratos que ingiere y consumir alcohol con prudencia. Esta informacin no tiene Theme park manager el consejo del mdico. Asegrese de hacerle al mdico cualquier pregunta que tenga. Document Revised: 01/28/2017 Document Reviewed: 09/09/2016 Elsevier Patient Education  2020 Elsevier Inc.      Edwina Barth, MD [facility]

## 2020-04-17 NOTE — Patient Instructions (Addendum)
   If you have lab work done today you will be contacted with your lab results within the next 2 weeks.  If you have not heard from us then please contact us. The fastest way to get your results is to register for My Chart.   IF you received an x-ray today, you will receive an invoice from [facility]. Please contact [facility] at [phone] with questions or concerns regarding your invoice.   IF you received labwork today, you will receive an invoice from [facility]. Please contact [facility] at [phone] with questions or concerns regarding your invoice.   Our billing staff will not be able to assist you with questions regarding bills from these companies.  You will be contacted with the lab results as soon as they are available. The fastest way to get your results is to activate your My Chart account. Instructions are located on the last page of this paperwork. If you have not heard from us regarding the results in 2 weeks, please contact this office.     Diabetes mellitus y nutricin, en adultos Diabetes Mellitus and Nutrition, Adult Si sufre de diabetes (diabetes mellitus), es muy importante tener hbitos alimenticios saludables debido a que sus niveles de azcar en la sangre (glucosa) se ven afectados en gran medida por lo que come y bebe. Comer alimentos saludables en las cantidades adecuadas, aproximadamente a la misma hora todos los das, lo ayudar a:  Controlar la glucemia.  Disminuir el riesgo de sufrir una enfermedad cardaca.  Mejorar la presin arterial.  Alcanzar o mantener un peso saludable. Todas las personas que sufren de diabetes son diferentes y cada una tiene necesidades diferentes en cuanto a un plan de alimentacin. El mdico puede recomendarle que trabaje con un especialista en dietas y nutricin (nutricionista) para elaborar el mejor plan para usted. Su plan de alimentacin puede variar segn factores como:  Las caloras que  necesita.  Los medicamentos que toma.  Su peso.  Sus niveles de glucemia, presin arterial y colesterol.  Su nivel de actividad.  Otras afecciones que tenga, como enfermedades cardacas o renales. Cmo me afectan los carbohidratos? Los carbohidratos, o hidratos de carbono, afectan su nivel de glucemia ms que cualquier otro tipo de alimento. La ingesta de carbohidratos naturalmente aumenta la cantidad de glucosa en la sangre. El recuento de carbohidratos es un mtodo destinado a llevar un registro de la cantidad de carbohidratos que se consumen. El recuento de carbohidratos es importante para mantener la glucemia a un nivel saludable, especialmente si utiliza insulina o toma determinados medicamentos por va oral para la diabetes. Es importante conocer la cantidad de carbohidratos que se pueden ingerir en cada comida sin correr ningn riesgo. Esto es diferente en cada persona. Su nutricionista puede ayudarlo a calcular la cantidad de carbohidratos que debe ingerir en cada comida y en cada refrigerio. Entre los alimentos que contienen carbohidratos, se incluyen:  Pan, cereal, arroz, pastas y galletas.  Papas y maz.  Guisantes, frijoles y lentejas.  Leche y yogur.  Frutas y jugo.  Postres, como pasteles, galletas, helado y caramelos. Cmo me afecta el alcohol? El alcohol puede provocar disminuciones sbitas de la glucemia (hipoglucemia), especialmente si utiliza insulina o toma determinados medicamentos por va oral para la diabetes. La hipoglucemia es una afeccin potencialmente mortal. Los sntomas de la hipoglucemia (somnolencia, mareos y confusin) son similares a los sntomas de haber consumido demasiado alcohol. Si el mdico afirma que el alcohol es seguro para usted, siga estas pautas:    Limite el consumo de alcohol a no ms de 1medida por da si es mujer y no est embarazada, y a 2medidas si es hombre. Una medida equivale a 12oz (355ml) de cerveza, 5oz (148ml) de vino o  1oz (44ml) de bebidas alcohlicas de alta graduacin.  No beba con el estmago vaco.  Mantngase hidratado bebiendo agua, refrescos dietticos o t helado sin azcar.  Tenga en cuenta que los refrescos comunes, los jugos y otras bebida para mezclar pueden contener mucha azcar y se deben contar como carbohidratos. Cules son algunos consejos para seguir este plan?  Leer las etiquetas de los alimentos  Comience por leer el tamao de la porcin en la "Informacin nutricional" en las etiquetas de los alimentos envasados y las bebidas. La cantidad de caloras, carbohidratos, grasas y otros nutrientes mencionados en la etiqueta se basan en una porcin del alimento. Muchos alimentos contienen ms de una porcin por envase.  Verifique la cantidad total de gramos (g) de carbohidratos totales en una porcin. Puede calcular la cantidad de porciones de carbohidratos al dividir el total de carbohidratos por 15. Por ejemplo, si un alimento tiene un total de 30g de carbohidratos, equivale a 2 porciones de carbohidratos.  Verifique la cantidad de gramos (g) de grasas saturadas y grasas trans en una porcin. Escoja alimentos que no contengan grasa o que tengan un bajo contenido.  Verifique la cantidad de miligramos (mg) de sal (sodio) en una porcin. La mayora de las personas deben limitar la ingesta de sodio total a menos de 2300mg por da.  Siempre consulte la informacin nutricional de los alimentos etiquetados como "con bajo contenido de grasa" o "sin grasa". Estos alimentos pueden tener un mayor contenido de azcar agregada o carbohidratos refinados, y deben evitarse.  Hable con su nutricionista para identificar sus objetivos diarios en cuanto a los nutrientes mencionados en la etiqueta. Al ir de compras  Evite comprar alimentos procesados, enlatados o precocinados. Estos alimentos tienden a tener una mayor cantidad de grasa, sodio y azcar agregada.  Compre en la zona exterior de la tienda de  comestibles. Esta zona incluye frutas y verduras frescas, granos a granel, carnes frescas y productos lcteos frescos. Al cocinar  Utilice mtodos de coccin a baja temperatura, como hornear, en lugar de mtodos de coccin a alta temperatura, como frer en abundante aceite.  Cocine con aceites saludables, como el aceite de oliva, canola o girasol.  Evite cocinar con manteca, crema o carnes con alto contenido de grasa. Planificacin de las comidas  Coma las comidas y los refrigerios regularmente, preferentemente a la misma hora todos los das. Evite pasar largos perodos de tiempo sin comer.  Consuma alimentos ricos en fibra, como frutas frescas, verduras, frijoles y cereales integrales. Consulte a su nutricionista sobre cuntas porciones de carbohidratos puede consumir en cada comida.  Consuma entre 4 y 6 onzas (oz) de protenas magras por da, como carnes magras, pollo, pescado, huevos o tofu. Una onza de protena magra equivale a: ? 1 onza de carne, pollo o pescado. ? 1huevo. ?  taza de tofu.  Coma algunos alimentos por da que contengan grasas saludables, como aguacates, frutos secos, semillas y pescado. Estilo de vida  Controle su nivel de glucemia con regularidad.  Haga actividad fsica habitualmente como se lo haya indicado el mdico. Esto puede incluir lo siguiente: ? 150minutos semanales de ejercicio de intensidad moderada o alta. Esto podra incluir caminatas dinmicas, ciclismo o gimnasia acutica. ? Realizar ejercicios de elongacin y de fortalecimiento, como yoga o levantamiento   de pesas, por lo menos 2veces por semana.  Tome los medicamentos como se lo haya indicado el mdico.  No consuma ningn producto que contenga nicotina o tabaco, como cigarrillos y cigarrillos electrnicos. Si necesita ayuda para dejar de fumar, consulte al mdico.  Trabaje con un asesor o instructor en diabetes para identificar estrategias para controlar el estrs y cualquier desafo emocional  y social. Preguntas para hacerle al mdico  Es necesario que consulte a un instructor en el cuidado de la diabetes?  Es necesario que me rena con un nutricionista?  A qu nmero puedo llamar si tengo preguntas?  Cules son los mejores momentos para controlar la glucemia? Dnde encontrar ms informacin:  Asociacin Estadounidense de la Diabetes (American Diabetes Association): diabetes.org  Academia de Nutricin y Diettica (Academy of Nutrition and Dietetics): www.eatright.org  Instituto Nacional de la Diabetes y las Enfermedades Digestivas y Renales (National Institute of Diabetes and Digestive and Kidney Diseases, NIH): www.niddk.nih.gov Resumen  Un plan de alimentacin saludable lo ayudar a controlar la glucemia y mantener un estilo de vida saludable.  Trabajar con un especialista en dietas y nutricin (nutricionista) puede ayudarlo a elaborar el mejor plan de alimentacin para usted.  Tenga en cuenta que los carbohidratos (hidratos de carbono) y el alcohol tienen efectos inmediatos en sus niveles de glucemia. Es importante contar los carbohidratos que ingiere y consumir alcohol con prudencia. Esta informacin no tiene como fin reemplazar el consejo del mdico. Asegrese de hacerle al mdico cualquier pregunta que tenga. Document Revised: 01/28/2017 Document Reviewed: 09/09/2016 Elsevier Patient Education  2020 Elsevier Inc.  

## 2020-07-19 ENCOUNTER — Ambulatory Visit: Payer: Self-pay | Admitting: Emergency Medicine

## 2020-10-27 ENCOUNTER — Other Ambulatory Visit: Payer: Self-pay | Admitting: Emergency Medicine

## 2020-10-27 DIAGNOSIS — E1165 Type 2 diabetes mellitus with hyperglycemia: Secondary | ICD-10-CM

## 2021-04-18 ENCOUNTER — Other Ambulatory Visit: Payer: Self-pay | Admitting: Emergency Medicine

## 2021-04-18 DIAGNOSIS — E1165 Type 2 diabetes mellitus with hyperglycemia: Secondary | ICD-10-CM

## 2022-04-16 ENCOUNTER — Ambulatory Visit (INDEPENDENT_AMBULATORY_CARE_PROVIDER_SITE_OTHER): Payer: Self-pay | Admitting: Emergency Medicine

## 2022-04-16 ENCOUNTER — Encounter: Payer: Self-pay | Admitting: Emergency Medicine

## 2022-04-16 VITALS — BP 126/80 | HR 109 | Temp 98.3°F | Ht 64.5 in | Wt 168.4 lb

## 2022-04-16 DIAGNOSIS — E1169 Type 2 diabetes mellitus with other specified complication: Secondary | ICD-10-CM

## 2022-04-16 DIAGNOSIS — E1165 Type 2 diabetes mellitus with hyperglycemia: Secondary | ICD-10-CM

## 2022-04-16 DIAGNOSIS — N5201 Erectile dysfunction due to arterial insufficiency: Secondary | ICD-10-CM | POA: Insufficient documentation

## 2022-04-16 DIAGNOSIS — E785 Hyperlipidemia, unspecified: Secondary | ICD-10-CM

## 2022-04-16 LAB — COMPREHENSIVE METABOLIC PANEL
ALT: 18 U/L (ref 0–53)
AST: 13 U/L (ref 0–37)
Albumin: 4.5 g/dL (ref 3.5–5.2)
Alkaline Phosphatase: 83 U/L (ref 39–117)
BUN: 11 mg/dL (ref 6–23)
CO2: 26 mEq/L (ref 19–32)
Calcium: 9.1 mg/dL (ref 8.4–10.5)
Chloride: 100 mEq/L (ref 96–112)
Creatinine, Ser: 0.64 mg/dL (ref 0.40–1.50)
GFR: 117.5 mL/min (ref >60.00)
Glucose, Bld: 189 mg/dL — ABNORMAL HIGH (ref 70–99)
Potassium: 3.8 mEq/L (ref 3.5–5.1)
Sodium: 134 mEq/L — ABNORMAL LOW (ref 135–145)
Total Bilirubin: 0.4 mg/dL (ref 0.2–1.2)
Total Protein: 6.9 g/dL (ref 6.0–8.3)

## 2022-04-16 LAB — POCT GLYCOSYLATED HEMOGLOBIN (HGB A1C): Hemoglobin A1C: 9.5 % — AB (ref 4.0–5.6)

## 2022-04-16 LAB — LIPID PANEL
Cholesterol: 181 mg/dL (ref 0–200)
HDL: 39.7 mg/dL (ref >39.00)
LDL Cholesterol: 116 mg/dL — ABNORMAL HIGH (ref 0–99)
NonHDL: 141.74
Total CHOL/HDL Ratio: 5
Triglycerides: 129 mg/dL (ref 0.0–149.0)
VLDL: 25.8 mg/dL (ref 0.0–40.0)

## 2022-04-16 LAB — MICROALBUMIN / CREATININE URINE RATIO
Creatinine,U: 39.8 mg/dL
Microalb Creat Ratio: 1.8 mg/g (ref 0.0–30.0)
Microalb, Ur: <0.7 mg/dL (ref 0.0–1.9)

## 2022-04-16 MED ORDER — TADALAFIL 20 MG PO TABS: 10.0000 mg | ORAL_TABLET | ORAL | 11 refills | Status: DC | PRN

## 2022-04-16 MED ORDER — ATORVASTATIN CALCIUM 20 MG PO TABS: 20.0000 mg | ORAL_TABLET | Freq: Every day | ORAL | 3 refills | Status: AC

## 2022-04-16 MED ORDER — METFORMIN HCL 1000 MG PO TABS: 1000.0000 mg | ORAL_TABLET | Freq: Two times a day (BID) | ORAL | 3 refills | Status: AC

## 2022-04-16 MED ORDER — GLIPIZIDE 5 MG PO TABS: 5.0000 mg | ORAL_TABLET | Freq: Two times a day (BID) | ORAL | 3 refills | Status: AC

## 2022-04-16 NOTE — Patient Instructions (Signed)
Diabetes mellitus y nutricin, en adultos Diabetes Mellitus and Nutrition, Adult Si sufre de diabetes, o diabetes mellitus, es muy importante tener hbitos alimenticios saludables debido a que sus niveles de azcar en la sangre (glucosa) se ven afectados en gran medida por lo que come y bebe. Comer alimentos saludables en las cantidades correctas, aproximadamente a la misma hora todos los das, lo ayudar a: Controlar su glucemia. Disminuir el riesgo de sufrir una enfermedad cardaca. Mejorar la presin arterial. Alcanzar o mantener un peso saludable. Qu puede afectar mi plan de alimentacin? Todas las personas que sufren de diabetes son diferentes y cada una tiene necesidades diferentes en cuanto a un plan de alimentacin. El mdico puede recomendarle que trabaje con un nutricionista para elaborar el mejor plan para usted. Su plan de alimentacin puede variar segn factores como: Las caloras que necesita. Los medicamentos que toma. Su peso. Sus niveles de glucemia, presin arterial y colesterol. Su nivel de actividad. Otras afecciones que tenga, como enfermedades cardacas o renales. Cmo me afectan los carbohidratos? Los carbohidratos, o hidratos de carbono, afectan su nivel de glucemia ms que cualquier otro tipo de alimento. La ingesta de carbohidratos aumenta la cantidad de glucosa en la sangre. Es importante conocer la cantidad de carbohidratos que se pueden ingerir en cada comida sin correr ningn riesgo. Esto es diferente en cada persona. Su nutricionista puede ayudarlo a calcular la cantidad de carbohidratos que debe ingerir en cada comida y en cada refrigerio. Cmo me afecta el alcohol? El alcohol puede provocar una disminucin de la glucemia (hipoglucemia), especialmente si usa insulina o toma determinados medicamentos por va oral para la diabetes. La hipoglucemia es una afeccin potencialmente mortal. Los sntomas de la hipoglucemia, como somnolencia, mareos y confusin, son  similares a los sntomas de haber consumido demasiado alcohol. No beba alcohol si: Su mdico le indica no hacerlo. Est embarazada, puede estar embarazada o est tratando de quedar embarazada. Si bebe alcohol: Limite la cantidad que bebe a lo siguiente: De 0 a 1 medida por da para las mujeres. De 0 a 2 medidas por da para los hombres. Sepa cunta cantidad de alcohol hay en las bebidas que toma. En [place], una medida equivale a una botella de cerveza de 12 oz (355 ml), un vaso de vino de 5 oz (148 ml) o un vaso de una bebida alcohlica de alta graduacin de 1 oz (44 ml). Mantngase hidratado bebiendo agua, refrescos dietticos o t helado sin azcar. Tenga en cuenta que los refrescos comunes, los jugos y otras bebidas para mezclar pueden contener mucha azcar y se deben contar como carbohidratos. Consejos para seguir este plan  Leer las etiquetas de los alimentos Comience por leer el tamao de la porcin en la etiqueta de Informacin nutricional de los alimentos envasados y las bebidas. La cantidad de caloras, carbohidratos, grasas y otros nutrientes detallados en la etiqueta se basan en una porcin del alimento. Muchos alimentos contienen ms de una porcin por envase. Verifique la cantidad total de gramos (g) de carbohidratos totales en una porcin. Verifique la cantidad de gramos de grasas saturadas y grasas trans en una porcin. Escoja alimentos que no contengan estas grasas o que su contenido de estas sea bajo. Verifique la cantidad de miligramos (mg) de sal (sodio) en una porcin. La mayora de las personas deben limitar la ingesta de sodio total a menos de 2300 mg por da. Siempre consulte la informacin nutricional de los alimentos etiquetados como "con bajo contenido de grasa" o "sin grasa".   Estos alimentos pueden tener un mayor contenido de azcar agregada o carbohidratos refinados, y deben evitarse. Hable con su nutricionista para identificar sus objetivos diarios en  cuanto a los nutrientes mencionados en la etiqueta. Al ir de compras Evite comprar alimentos procesados, enlatados o precocidos. Estos alimentos tienden a tener una mayor cantidad de grasa, sodio y azcar agregada. Compre en la zona exterior de la tienda de comestibles. Esta es la zona donde se encuentran con mayor frecuencia las frutas y las verduras frescas, los cereales a granel, las carnes frescas y los productos lcteos frescos. Al cocinar Use mtodos de coccin a baja temperatura, como hornear, en lugar de mtodos de coccin a alta temperatura, como frer en abundante aceite. Cocine con aceites saludables, como el aceite de oliva, canola o girasol. Evite cocinar con manteca, crema o carnes con alto contenido de grasa. Planificacin de las comidas Coma las comidas y los refrigerios regularmente, preferentemente a la misma hora todos los das. Evite pasar largos perodos de tiempo sin comer. Consuma alimentos ricos en fibra, como frutas frescas, verduras, frijoles y cereales integrales. Consuma entre 4 y 6 onzas (entre 112 y 168 g) de protenas magras por da, como carnes magras, pollo, pescado, huevos o tofu. Una onza (oz) (28 g) de protena magra equivale a: 1 onza (28 g) de carne, pollo o pescado. 1 huevo.  taza (62 g) de tofu. Coma algunos alimentos por da que contengan grasas saludables, como aguacates, frutos secos, semillas y pescado. Qu alimentos debo comer? Frutas Bayas. Manzanas. Naranjas. Duraznos. Damascos. Ciruelas. Uvas. Mangos. Papayas. Granadas. Kiwi. Cerezas. Verduras Verduras de hoja verde, que incluyen lechuga, espinaca, col rizada, acelga, hojas de berza, hojas de mostaza y repollo. Remolachas. Coliflor. Brcoli. Zanahorias. Judas verdes. Tomates. Pimientos. Cebollas. Pepinos. Coles de Bruselas. Granos Granos integrales, como panes, galletas, tortillas, cereales y pastas de salvado o integrales. Avena sin azcar. Quinua. Arroz integral o salvaje. Carnes y otras  protenas Frutos de mar. Carne de ave sin piel. Cortes magros de ave y carne de res. Tofu. Frutos secos. Semillas. Lcteos Productos lcteos sin grasa o con bajo contenido de grasa, como leche, yogur y queso. Es posible que los productos detallados arriba no constituyan una lista completa de los alimentos y las bebidas que puede tomar. Consulte a un nutricionista para obtener ms informacin. Qu alimentos debo evitar? Frutas Frutas enlatadas al almbar. Verduras Verduras enlatadas. Verduras congeladas con mantequilla o salsa de crema. Granos Productos elaborados con harina y harina blanca refinada, como panes, pastas, bocadillos y cereales. Evite todos los alimentos procesados. Carnes y otras protenas Cortes de carne con alto contenido de grasa. Carne de ave con piel. Carnes empanizadas o fritas. Carne procesada. Evite las grasas saturadas. Lcteos Yogur, queso o leche enteros. Bebidas Bebidas azucaradas, como gaseosas o t helado. Es posible que los productos que se enumeran ms arriba no constituyan una lista completa de los alimentos y las bebidas que debe evitar. Consulte a un nutricionista para obtener ms informacin. Preguntas para hacerle al mdico Debo consultar con un especialista certificado en atencin y educacin sobre la diabetes? Es necesario que me rena con un nutricionista? A qu nmero puedo llamar si tengo preguntas? Cules son los mejores momentos para controlar la glucemia? Dnde encontrar ms informacin: American Diabetes Association (Asociacin Estadounidense de la Diabetes): diabetes.org Academy of Nutrition and Dietetics (Academia de Nutricin y Diettica): eatright.org National Institute of Diabetes and Digestive and Kidney Diseases (Instituto Nacional de la Diabetes y las Enfermedades Digestivas y Renales): niddk.nih.gov Association of Diabetes   Care & Education Specialists (Asociacin de Especialistas en Atencin y Educacin sobre la Diabetes):  diabeteseducator.org Resumen Es importante tener hbitos alimenticios saludables debido a que sus niveles de azcar en la sangre (glucosa) se ven afectados en gran medida por lo que come y bebe. Es importante consumir alcohol con prudencia. Un plan de comidas saludable lo ayudar a controlar la glucosa en sangre y a reducir el riesgo de enfermedades cardacas. El mdico puede recomendarle que trabaje con un nutricionista para elaborar el mejor plan para usted. Esta informacin no tiene como fin reemplazar el consejo del mdico. Asegrese de hacerle al mdico cualquier pregunta que tenga. Document Revised: 01/26/2020 Document Reviewed: 01/26/2020 Elsevier Patient Education  2023 Elsevier Inc.  

## 2022-04-16 NOTE — Assessment & Plan Note (Signed)
Uncontrolled diabetes with hemoglobin A1c of 9.5.  Has been off medications for at least 8 months. Recommend to start metformin 1000 mg and glipizide 5 mg twice a day. Cardiovascular risks associated with uncontrolled diabetes discussed. Diet and nutrition discussed. Follow-up in 3 months.

## 2022-04-16 NOTE — Progress Notes (Signed)
Arthur Cox 41 y.o.   Chief Complaint  Patient presents with   Follow-up    F/u appt patient has not been seen since 2021 no concerns     HISTORY OF PRESENT ILLNESS: This is a 41 y.o. male here for follow-up of diabetes. Last office visit with me 2 years ago. Off diabetic medications for at least 8 months. Still struggling with erectile dysfunction.  HPI   Prior to Admission medications   Medication Sig Start Date End Date Taking? Authorizing Provider  glipiZIDE (GLUCOTROL) 5 MG tablet Take 1 tablet (5 mg total) by mouth 2 (two) times daily with a meal. 04/17/20 07/16/20  Georgina QuintSagardia, Saniyya Gau Jose, MD  metFORMIN (GLUCOPHAGE) 1000 MG tablet TAKE 1 TABLET BY MOUTH TWICE DAILY WITH A MEAL Patient not taking: Reported on 04/16/2022 04/19/21   Georgina QuintSagardia, Leontae Bostock Jose, MD  rosuvastatin (CRESTOR) 20 MG tablet Take 1 tablet (20 mg total) by mouth daily. Patient not taking: Reported on 04/16/2022 04/17/20   Georgina QuintSagardia, Merlinda Wrubel Jose, MD  sildenafil (VIAGRA) 100 MG tablet Take 0.5-1 tablets (50-100 mg total) by mouth daily as needed for erectile dysfunction. Patient not taking: Reported on 04/16/2022 01/04/20   Georgina QuintSagardia, Jodiann Ognibene Jose, MD    No Known Allergies  Patient Active Problem List   Diagnosis Date Noted   Hyperlipidemia associated with type 2 diabetes mellitus (HCC) 04/17/2020   Type 2 diabetes mellitus with hyperglycemia, without long-term current use of insulin (HCC) 12/17/2012   HTN (hypertension) 12/17/2012    Past Medical History:  Diagnosis Date   Asthma    Cellulitis 12/17/2012   RIGHT FOOT   Diabetes mellitus     Past Surgical History:  Procedure Laterality Date   NO PAST SURGERIES      Social History   Socioeconomic History   Marital status: Married    Spouse name: Not on file   Number of children: Not on file   Years of education: Not on file   Highest education level: Not on file  Occupational History   Not on file  Tobacco Use   Smoking status: Never    Smokeless tobacco: Never  Substance and Sexual Activity   Alcohol use: Yes    Comment: ONCE  A WEEK   Drug use: No   Sexual activity: Not on file  Other Topics Concern   Not on file  Social History Narrative   Not on file   Social Determinants of Health   Financial Resource Strain: Not on file  Food Insecurity: Not on file  Transportation Needs: Not on file  Physical Activity: Not on file  Stress: Not on file  Social Connections: Not on file  Intimate Partner Violence: Not on file    History reviewed. No pertinent family history.   Review of Systems  Constitutional: Negative.  Negative for chills and fever.  HENT: Negative.  Negative for congestion and sore throat.   Respiratory: Negative.  Negative for cough and shortness of breath.   Cardiovascular: Negative.  Negative for chest pain and palpitations.  Gastrointestinal: Negative.  Negative for abdominal pain, nausea and vomiting.  Genitourinary: Negative.  Negative for dysuria and hematuria.       Erectile dysfunction  Skin: Negative.  Negative for rash.  Neurological: Negative.  Negative for dizziness and headaches.  All other systems reviewed and are negative.  Today's Vitals   04/16/22 1548  BP: 126/80  Pulse: (!) 109  Temp: 98.3 F (36.8 C)  TempSrc: Oral  SpO2: 98%  Weight:  168 lb 6 oz (76.4 kg)  Height: 5' 4.5" (1.638 m)   Body mass index is 28.46 kg/m.   Physical Exam Vitals reviewed.  Constitutional:      Appearance: Normal appearance.  HENT:     Head: Normocephalic and atraumatic.  Eyes:     Extraocular Movements: Extraocular movements intact.     Pupils: Pupils are equal, round, and reactive to light.  Cardiovascular:     Rate and Rhythm: Normal rate and regular rhythm.     Pulses: Normal pulses.     Heart sounds: Normal heart sounds.  Pulmonary:     Effort: Pulmonary effort is normal.     Breath sounds: Normal breath sounds.  Abdominal:     Palpations: Abdomen is soft.      Tenderness: There is no abdominal tenderness.  Musculoskeletal:        General: Normal range of motion.  Skin:    General: Skin is warm and dry.  Neurological:     General: No focal deficit present.     Mental Status: He is alert and oriented to person, place, and time.  Psychiatric:        Mood and Affect: Mood normal.        Behavior: Behavior normal.    Results for orders placed or performed in visit on 04/16/22 (from the past 24 hour(s))  POCT HgB A1C     Status: Abnormal   Collection Time: 04/16/22  3:57 PM  Result Value Ref Range   Hemoglobin A1C 9.5 (A) 4.0 - 5.6 %   HbA1c POC (<> result, manual entry)     HbA1c, POC (prediabetic range)     HbA1c, POC (controlled diabetic range)       ASSESSMENT & PLAN: Problem List Items Addressed This Visit       Cardiovascular and Mediastinum   Erectile dysfunction due to arterial insufficiency    Failed Viagra in the past.  Requesting prescription for Cialis and urology evaluation.  Referral placed today.      Relevant Medications   atorvastatin (LIPITOR) 20 MG tablet   tadalafil (CIALIS) 20 MG tablet   Other Relevant Orders   Ambulatory referral to Urology     Endocrine   Type 2 diabetes mellitus with hyperglycemia, without long-term current use of insulin (HCC) - Primary    Uncontrolled diabetes with hemoglobin A1c of 9.5.  Has been off medications for at least 8 months. Recommend to start metformin 1000 mg and glipizide 5 mg twice a day. Cardiovascular risks associated with uncontrolled diabetes discussed. Diet and nutrition discussed. Follow-up in 3 months.      Relevant Medications   metFORMIN (GLUCOPHAGE) 1000 MG tablet   glipiZIDE (GLUCOTROL) 5 MG tablet   atorvastatin (LIPITOR) 20 MG tablet   Other Relevant Orders   POCT HgB A1C (Completed)   Hyperlipidemia associated with type 2 diabetes mellitus (HCC)    Diet and nutrition discussed.  Recommend to start atorvastatin 20 mg daily.  Lipid profile done  today.       Relevant Medications   metFORMIN (GLUCOPHAGE) 1000 MG tablet   glipiZIDE (GLUCOTROL) 5 MG tablet   atorvastatin (LIPITOR) 20 MG tablet   tadalafil (CIALIS) 20 MG tablet   Other Relevant Orders   Comprehensive metabolic panel   Lipid panel   Urine Microalbumin w/creat. ratio   Patient Instructions  Diabetes mellitus y nutricin, en adultos Diabetes Mellitus and Nutrition, Adult Si sufre de diabetes, o diabetes mellitus, es Photographer  hbitos alimenticios saludables debido a que sus niveles de Banker (glucosa) se ven afectados en gran medida por lo que come y bebe. [place] alimentos saludables en las cantidades correctas, aproximadamente a la misma hora todos los [place], [place] ayudar a: Chief Operating Officer su glucemia. Disminuir el riesgo de sufrir una enfermedad cardaca. Mejorar la presin arterial. Barista o mantener un peso saludable. Qu puede afectar mi plan de alimentacin? Todas las personas que sufren de diabetes son diferentes y cada una tiene necesidades diferentes en cuanto a un plan de alimentacin. El mdico puede recomendarle que trabaje con un nutricionista para elaborar el mejor plan para usted. Su plan de alimentacin puede variar segn factores como: Las caloras que necesita. Los medicamentos que toma. Su peso. Sus niveles de glucemia, presin arterial y colesterol. Su nivel de [place]. Otras afecciones que tenga, como enfermedades cardacas o renales. Cmo me afectan los carbohidratos? Los carbohidratos, o hidratos de carbono, afectan su nivel de glucemia ms que cualquier otro tipo de alimento. La ingesta de carbohidratos aumenta la cantidad de [employer]. Es importante conocer la cantidad de carbohidratos que se pueden ingerir en cada comida sin correr Surveyor, minerals. Esto es Government social research officer. Su nutricionista puede ayudarlo a calcular la cantidad de carbohidratos que debe ingerir en cada comida y en cada  refrigerio. Cmo me afecta el alcohol? El alcohol puede provocar una disminucin de la glucemia (hipoglucemia), especialmente si [place] insulina o toma determinados medicamentos por va oral para la diabetes. La hipoglucemia es una afeccin potencialmente mortal. Los sntomas de la hipoglucemia, como somnolencia, mareos y confusin, son similares a los sntomas de haber consumido demasiado alcohol. No beba alcohol si: Su mdico le indica no hacerlo. Est embarazada, puede estar embarazada o est tratando de [place]. Si bebe alcohol: Limite la cantidad que bebe a lo siguiente: De 0 a 1 medida por da para las mujeres. De 0 a 2 medidas por da para los hombres. Sepa cunta cantidad de alcohol hay en las bebidas que toma. En los [address], una medida equivale a una botella de cerveza de 12 oz (355 ml), un vaso de vino de 5 oz (148 ml) o un vaso de una bebida alcohlica de alta graduacin de 1 oz (44 ml). Mantngase hidratado bebiendo agua, refrescos dietticos o t helado sin azcar. Tenga en cuenta que los refrescos comunes, los jugos y otras bebidas para mezclar pueden contener Product/process development scientist y se deben contar como carbohidratos. Consejos para seguir Social worker las etiquetas de los alimentos Comience por leer el tamao de la porcin en la etiqueta de Informacin nutricional de los alimentos envasados y las bebidas. La cantidad de caloras, carbohidratos, grasas y otros nutrientes detallados en la etiqueta se basan en una porcin del alimento. Muchos alimentos contienen ms de una porcin por envase. Verifique la cantidad total de gramos (g) de carbohidratos totales en una porcin. Verifique la cantidad de gramos de grasas saturadas y grasas trans en una porcin. Escoja alimentos que no contengan estas grasas o que su contenido de estas sea [place]. Verifique la cantidad de miligramos (mg) de sal (sodio) en una porcin. La [employer] de las personas deben limitar la ingesta de sodio total  a menos de 2300 mg Google. Siempre consulte la informacin nutricional de los alimentos etiquetados como "con bajo contenido de grasa" o "sin grasa". Estos alimentos pueden tener un mayor contenido de International aid/development worker agregada o carbohidratos refinados, y deben evitarse. Hable con su nutricionista para  identificar sus objetivos diarios en cuanto a los nutrientes mencionados en la etiqueta. Al ir de compras Evite comprar alimentos procesados, enlatados o precocidos. Estos alimentos tienden a Counselling psychologist mayor cantidad de [place], sodio y azcar agregada. Compre en la zona exterior de la tienda de comestibles. Esta es la zona donde se encuentran con mayor frecuencia las frutas y las verduras frescas, los cereales a granel, las carnes frescas y los productos lcteos frescos. Al cocinar Use mtodos de coccin a baja temperatura, como hornear, en lugar de mtodos de coccin a alta temperatura, como frer en abundante aceite. Cocine con aceites saludables, como el aceite de [place], canola o [place]. Evite cocinar con manteca, crema o carnes con alto contenido de grasa. Planificacin de las comidas Coma las comidas y los refrigerios regularmente, preferentemente a la misma hora todos [place]. Evite pasar largos perodos de tiempo sin comer. Consuma alimentos ricos en fibra, como frutas frescas, verduras, frijoles y cereales integrales. Consuma entre 4 y 6 onzas (entre 112 y 168 g) de protenas magras por da, como carnes [place], pollo, pescado, huevos o tofu. Una onza (oz) (28 g) de protena magra equivale a: 1 onza (28 g) de carne, pollo o pescado. 1 huevo.  taza (62 g) de tofu. Coma algunos alimentos por da que contengan grasas saludables, como aguacates, frutos secos, semillas y pescado. Qu alimentos debo comer? Nils Pyle Bayas. Manzanas. Naranjas. Duraznos. Damascos. Ciruelas. Uvas. Mangos. Papayas. Granadas. Kiwi. Cerezas. Verduras Verduras de [employer], que incluyen [place], [place], col rizada, acelga,  hojas de berza, hojas de mostaza y repollo. Remolachas. Coliflor. Brcoli. Zanahorias. Judas verdes. Tomates. Pimientos. Cebollas. Pepinos. Coles de Bruselas. Granos Granos integrales, como panes, galletas, tortillas, cereales y pastas de salvado o integrales. Avena sin azcar. Quinua. Arroz integral o salvaje. Carnes y otras protenas Frutos de mar. Carne de ave sin piel. Cortes magros de ave y carne de res. Tofu. Frutos secos. Semillas. Lcteos Productos lcteos sin grasa o con bajo contenido de [place], [place], yogur y [place]. Es posible que los productos detallados arriba no constituyan una lista completa de los alimentos y las bebidas que puede tomar. Consulte a un nutricionista para obtener ms informacin. Qu alimentos debo evitar? Nils Pyle Frutas enlatadas al almbar. Verduras Verduras enlatadas. Verduras congeladas con mantequilla o salsa de crema. Granos Productos elaborados con [place] y [place], como panes, pastas, bocadillos y cereales. Evite todos los alimentos procesados. Carnes y [address] de carne con alto contenido de Holiday representative. Carne de ave con piel. Carnes empanizadas o fritas. Carne procesada. Evite las grasas saturadas. Lcteos Yogur, queso o [employer] Health. Bebidas Bebidas azucaradas, como gaseosas o t helado. Es posible que los productos que se enumeran ms [place] no constituyan una lista completa de los alimentos y las bebidas que Personnel officer. Consulte a un nutricionista para obtener ms informacin. Preguntas para hacerle al mdico Debo consultar con un especialista certificado en atencin y educacin sobre la diabetes? Es necesario que me rena con un nutricionista? A qu nmero puedo llamar si tengo preguntas? Cules son los mejores momentos para controlar la glucemia? Dnde encontrar ms informacin: American Diabetes Association (Asociacin Estadounidense de la Diabetes): diabetes.org Academy of Nutrition and Dietetics (Academia  de Nutricin y Pension scheme manager): eatright.[employer] of Diabetes and Digestive and Kidney Diseases [employer] de la Diabetes y las Enfermedades Digestivas y Renales): StageSync.si Association of Diabetes Care & Education Specialists (Asociacin de Especialistas en Atencin y Educacin sobre la Diabetes): diabeteseducator.org Resumen Es importante tener hbitos alimenticios  saludables debido a que sus niveles de Banker (glucosa) se ven afectados en gran medida por lo que come y bebe. Es importante consumir alcohol con prudencia. Un plan de comidas saludable lo ayudar a controlar la glucosa en sangre y a reducir el riesgo de enfermedades cardacas. El mdico puede recomendarle que trabaje con un nutricionista para elaborar el mejor plan para usted. Esta informacin no tiene Theme park manager el consejo del mdico. Asegrese de hacerle al mdico cualquier pregunta que tenga. Document Revised: 01/26/2020 Document Reviewed: 01/26/2020 Elsevier Patient Education  2023 Elsevier Inc.     Edwina Barth, MD  [facility] at [facility]

## 2022-04-16 NOTE — Assessment & Plan Note (Signed)
Failed Viagra in the past.  Requesting prescription for Cialis and urology evaluation.  Referral placed today.

## 2022-04-16 NOTE — Assessment & Plan Note (Signed)
Diet and nutrition discussed.  Recommend to start atorvastatin 20 mg daily.  Lipid profile done today.

## 2022-04-18 ENCOUNTER — Telehealth: Payer: Self-pay | Admitting: Emergency Medicine

## 2022-04-18 NOTE — Telephone Encounter (Signed)
Patient would like to change from the cialis to the viagra -   Please sent to [facility] pharmacy on [address].

## 2022-04-20 MED ORDER — SILDENAFIL CITRATE 100 MG PO TABS: 50.0000 mg | ORAL_TABLET | Freq: Every day | ORAL | 11 refills | Status: AC | PRN

## 2022-04-20 NOTE — Telephone Encounter (Signed)
New prescription for Viagra sent to pharmacy of record.  Thanks
